# Patient Record
Sex: Female | Born: 1935 | Race: White | Hispanic: No | Smoking: Never smoker
Health system: Southern US, Community
[De-identification: ages and names within clinical notes are randomized; demographics above are authoritative.]

## PROBLEM LIST (undated history)

## (undated) DIAGNOSIS — K222 Esophageal obstruction: Secondary | ICD-10-CM

## (undated) DIAGNOSIS — C50919 Malignant neoplasm of unspecified site of unspecified female breast: Secondary | ICD-10-CM

## (undated) DIAGNOSIS — R51 Headache: Secondary | ICD-10-CM

## (undated) DIAGNOSIS — F99 Mental disorder, not otherwise specified: Secondary | ICD-10-CM

## (undated) HISTORY — DX: Headache: R51

## (undated) HISTORY — PX: BACK SURGERY: SHX140

## (undated) HISTORY — DX: Mental disorder, not otherwise specified: F99

## (undated) HISTORY — PX: CATARACT EXTRACTION: SUR2

## (undated) HISTORY — PX: APPENDECTOMY: SHX54

## (undated) HISTORY — DX: Esophageal obstruction: K22.2

## (undated) HISTORY — PX: ABDOMINAL HYSTERECTOMY: SHX81

## (undated) HISTORY — DX: Malignant neoplasm of unspecified site of unspecified female breast: C50.919

---

## 1966-08-26 HISTORY — PX: MASTECTOMY: SHX3

## 2009-01-24 HISTORY — PX: OTHER SURGICAL HISTORY: SHX169

## 2009-02-02 ENCOUNTER — Encounter: Admission: RE | Admit: 2009-02-02 | Discharge: 2009-04-27 | Payer: Self-pay | Admitting: Orthopedic Surgery

## 2009-09-26 DIAGNOSIS — F99 Mental disorder, not otherwise specified: Secondary | ICD-10-CM

## 2009-09-26 HISTORY — DX: Mental disorder, not otherwise specified: F99

## 2009-10-05 ENCOUNTER — Ambulatory Visit: Payer: Self-pay | Admitting: Family Medicine

## 2009-10-05 DIAGNOSIS — Z853 Personal history of malignant neoplasm of breast: Secondary | ICD-10-CM

## 2009-10-05 DIAGNOSIS — F068 Other specified mental disorders due to known physiological condition: Secondary | ICD-10-CM

## 2009-10-05 DIAGNOSIS — N951 Menopausal and female climacteric states: Secondary | ICD-10-CM | POA: Insufficient documentation

## 2009-10-06 LAB — CONVERTED CEMR LAB
ALT: 30 units/L (ref 0–35)
AST: 25 units/L (ref 0–37)
Albumin: 4.4 g/dL (ref 3.5–5.2)
Alkaline Phosphatase: 71 units/L (ref 39–117)
BUN: 21 mg/dL (ref 6–23)
CO2: 22 meq/L (ref 19–32)
Cholesterol: 318 mg/dL — ABNORMAL HIGH (ref 0–200)
Creatinine, Ser: 0.98 mg/dL (ref 0.40–1.20)
Glucose, Bld: 98 mg/dL (ref 70–99)
MCHC: 33.3 g/dL (ref 30.0–36.0)
MCV: 92.6 fL (ref 78.0–100.0)
Total Protein: 7.3 g/dL (ref 6.0–8.3)
VLDL: 24 mg/dL (ref 0–40)
Vit D, 25-Hydroxy: 28 ng/mL — ABNORMAL LOW (ref 30–89)
WBC: 7.7 10*3/uL (ref 4.0–10.5)

## 2009-10-12 ENCOUNTER — Ambulatory Visit: Payer: Self-pay | Admitting: Family Medicine

## 2009-10-12 DIAGNOSIS — E785 Hyperlipidemia, unspecified: Secondary | ICD-10-CM

## 2009-10-12 DIAGNOSIS — E559 Vitamin D deficiency, unspecified: Secondary | ICD-10-CM | POA: Insufficient documentation

## 2009-10-14 ENCOUNTER — Encounter: Admission: RE | Admit: 2009-10-14 | Discharge: 2009-10-14 | Payer: Self-pay | Admitting: Family Medicine

## 2009-10-14 ENCOUNTER — Encounter: Payer: Self-pay | Admitting: Family Medicine

## 2009-10-18 ENCOUNTER — Telehealth: Payer: Self-pay | Admitting: Family Medicine

## 2009-10-24 ENCOUNTER — Encounter: Payer: Self-pay | Admitting: Family Medicine

## 2009-10-24 ENCOUNTER — Telehealth: Payer: Self-pay | Admitting: Family Medicine

## 2009-10-24 DIAGNOSIS — K222 Esophageal obstruction: Secondary | ICD-10-CM

## 2009-10-24 HISTORY — DX: Esophageal obstruction: K22.2

## 2009-10-24 LAB — HM COLONOSCOPY: HM Colonoscopy: NORMAL

## 2009-10-25 ENCOUNTER — Encounter: Payer: Self-pay | Admitting: Family Medicine

## 2009-10-27 ENCOUNTER — Encounter: Payer: Self-pay | Admitting: Family Medicine

## 2009-12-01 ENCOUNTER — Ambulatory Visit: Payer: Self-pay | Admitting: Family Medicine

## 2009-12-01 DIAGNOSIS — M4850XA Collapsed vertebra, not elsewhere classified, site unspecified, initial encounter for fracture: Secondary | ICD-10-CM

## 2009-12-13 ENCOUNTER — Telehealth: Payer: Self-pay | Admitting: Family Medicine

## 2009-12-13 DIAGNOSIS — R9409 Abnormal results of other function studies of central nervous system: Secondary | ICD-10-CM

## 2009-12-23 ENCOUNTER — Encounter: Admission: RE | Admit: 2009-12-23 | Discharge: 2009-12-23 | Payer: Self-pay | Admitting: Family Medicine

## 2010-01-26 ENCOUNTER — Encounter: Payer: Self-pay | Admitting: Family Medicine

## 2010-04-02 ENCOUNTER — Telehealth: Payer: Self-pay | Admitting: Family Medicine

## 2010-04-05 ENCOUNTER — Ambulatory Visit: Payer: Self-pay | Admitting: Family Medicine

## 2010-04-06 LAB — CONVERTED CEMR LAB
BUN: 14 mg/dL (ref 6–23)
Creatinine, Ser: 0.94 mg/dL (ref 0.40–1.20)

## 2010-04-07 ENCOUNTER — Encounter: Admission: RE | Admit: 2010-04-07 | Discharge: 2010-04-07 | Payer: Self-pay | Admitting: Family Medicine

## 2010-04-26 ENCOUNTER — Encounter: Payer: Self-pay | Admitting: Family Medicine

## 2010-05-18 ENCOUNTER — Encounter: Payer: Self-pay | Admitting: Family Medicine

## 2010-05-20 LAB — CONVERTED CEMR LAB
Cholesterol: 253 mg/dL — ABNORMAL HIGH (ref 0–200)
LDL Cholesterol: 177 mg/dL — ABNORMAL HIGH (ref 0–99)
Total CHOL/HDL Ratio: 5.1
VLDL: 26 mg/dL (ref 0–40)

## 2010-06-18 ENCOUNTER — Telehealth: Payer: Self-pay | Admitting: Family Medicine

## 2010-06-19 ENCOUNTER — Encounter: Payer: Self-pay | Admitting: Family Medicine

## 2010-06-20 LAB — CONVERTED CEMR LAB
Cholesterol: 210 mg/dL — ABNORMAL HIGH (ref 0–200)
VLDL: 27 mg/dL (ref 0–40)

## 2010-09-13 ENCOUNTER — Telehealth: Payer: Self-pay | Admitting: Family Medicine

## 2010-09-21 ENCOUNTER — Telehealth: Payer: Self-pay | Admitting: Family Medicine

## 2010-09-25 NOTE — Consult Note (Signed)
Summary: [facility]  [facility]   Imported By: Lanelle Bal 05/08/2010 11:35:28  _____________________________________________________________________  External Attachment:    Type:   Image     Comment:   External Document

## 2010-09-25 NOTE — Letter (Signed)
Summary: Letter to Patient Regarding Path Results/[facility]  Letter to Patient Regarding Path Results/[facility]   Imported By: Lanelle Bal 11/03/2009 11:26:14  _____________________________________________________________________  External Attachment:    Type:   Image     Comment:   External Document

## 2010-09-25 NOTE — Procedures (Signed)
Summary: Colonoscopy- [facility]   Flex Sig Next Due:  Not Indicated Colonoscopy Result Date:  10/24/2009 Colonoscopy Result:  normal Colonoscopy Next Due:  10 yr Hemoccult Next Due:  Not Indicated PAP Next Due:  Not Indicated  Also had endoscopy which showed a schatzkis ring.      Past History:  Past Medical History: Breast cancer, hx of Headache MMSE 27/30 on 09/2009 endoscopy which showed a schatzkis ring 10/2009 [facility]    Appended Document: Colonoscopy- [facility]  Last Colonoscopy:  normal (10/24/2009 2:10:05 PM) Colonoscopy Next Due:  5 yr

## 2010-09-25 NOTE — Letter (Signed)
Summary: Letter Regarding Colonoscopy Results/[facility]  Letter Regarding Colonoscopy Results/[facility]   Imported By: Lanelle Bal 11/06/2009 12:08:47  _____________________________________________________________________  External Attachment:    Type:   Image     Comment:   External Document

## 2010-09-25 NOTE — Progress Notes (Signed)
Summary: MRI report results.   Phone Note Outgoing Call   Summary of Call: Call pt: May have a tiny aneurysm  versus a pseudo-lesion of teh left. Otherwise normal. Nothing that indicates a reason for memory loss. They do recommend a f/u MRA to make sure stable in 2-3 months.  Initial call taken by: Nani Gasser MD,  October 18, 2009 2:58 PM  Follow-up for Phone Call        Pt notified of above results and MD instructions. KJ LPN Follow-up by: Kathlene November,  October 18, 2009 3:12 PM

## 2010-09-25 NOTE — Letter (Signed)
Summary: MMSE Form/[place] [facility]  MMSE Form/[place] [facility]   Imported By: Lanelle Bal 10/17/2009 12:56:22  _____________________________________________________________________  External Attachment:    Type:   Image     Comment:   External Document

## 2010-09-25 NOTE — Progress Notes (Signed)
Summary: ? MRI  Phone Note Call from Patient Call back at Home Phone [phone]   Caller: Patient Call For: Nani Gasser MD Summary of Call: pt called and wants to know if you want to do another MRI on her head. York Spaniel had one done in February and thought you said would need a followup on it Initial call taken by: Kathlene November,  December 13, 2009 9:49 AM  Follow-up for Phone Call        Will schedule.  Follow-up by: Nani Gasser MD,  December 13, 2009 12:53 PM  New Problems: MRI, BRAIN, ABNORMAL (ICD-794.09)   New Problems: MRI, BRAIN, ABNORMAL (ICD-794.09)

## 2010-09-25 NOTE — Assessment & Plan Note (Signed)
Summary: BACK PAIN   Vital Signs:  Patient Profile:   75 Years Old Female CC:      fell this am onto back Height:     63 inches Weight:      150 pounds O2 Sat:      98 % O2 treatment:    Room Air Temp:     97.7 degrees F oral Pulse rate:   77 / minute Resp:     12 per minute BP standing:   148 / 84  (right arm) Cuff size:   regular  Pt. in pain?   yes    Location:   lower back    Intensity:   8    Type:       aching  Vitals Entered By: Lajean Saver RN (December 01, 2009 11:38 AM)                   Updated Prior Medication List: No Medications Current Allergies (reviewed today): ! CODEINE ! LIPITOR (ATORVASTATIN CALCIUM) ! CRESTOR (ROSUVASTATIN CALCIUM)History of Present Illness Chief Complaint: fell this am onto back History of Present Illness: Subjective:  While sitting on a small stool today, patient fell backwards landing on her buttocks and back.  She complains primarily of pain in her buttocks.  She has a past history of spinal fracture.  No distal paresthesias.   Her back pain does not radiate.  No bowel or bladder dysfunction.  No saddle numbness.  REVIEW OF SYSTEMS Constitutional Symptoms      Denies fever, chills, night sweats, weight loss, weight gain, and fatigue.  Eyes       Denies change in vision, eye pain, eye discharge, glasses, contact lenses, and eye surgery. Ear/Nose/Throat/Mouth       Denies hearing loss/aids, change in hearing, ear pain, ear discharge, dizziness, frequent runny nose, frequent nose bleeds, sinus problems, sore throat, hoarseness, and tooth pain or bleeding.  Respiratory       Denies dry cough, productive cough, wheezing, shortness of breath, asthma, bronchitis, and emphysema/COPD.  Cardiovascular       Denies murmurs, chest pain, and tires easily with exhertion.    Gastrointestinal       Denies stomach pain, nausea/vomiting, diarrhea, constipation, blood in bowel movements, and indigestion. Genitourniary       Denies painful  urination, kidney stones, and loss of urinary control. Neurological       Denies paralysis, seizures, and fainting/blackouts. Musculoskeletal       Complains of muscle pain.      Denies joint pain, joint stiffness, decreased range of motion, redness, swelling, muscle weakness, and gout.      Comments: lower back Skin       Denies bruising, unusual mles/lumps or sores, and hair/skin or nail changes.  Psych       Denies mood changes, temper/anger issues, anxiety/stress, speech problems, depression, and sleep problems. Other Comments: patient fell from stool this AM onto back. Small abrasion on left elbow   Past History:  Past Medical History: Reviewed history from 10/25/2009 and no changes required. Breast cancer, hx of Headache MMSE 27/30 on 09/2009 endoscopy which showed a schatzkis ring 10/2009 [facility]  Past Surgical History: Caesarean section Cataract extraction Hysterectomy Mastectomy, bilat and then recontruction.  1968 Back surgery Rt Shoulder 01/2009  Family History: Reviewed history from 10/05/2009 and no changes required. Family History Breast cancer 1st degree relative <50 Family History Diabetes 1st degree relative Family History High cholesterol Family History  Hypertension Family History of Stroke M 1st degree relative <50  Social History: Reviewed history from 10/05/2009 and no changes required. Retired  from [employer]. Lives with her son.  Widow/Widower Never Smoked Alcohol use-no Drug use-no Regular exercise-yes   Objective:  No acute distress  Eyes:  Pupils are equal, round, and reactive to light and accomdation.  Extraocular movement is intact.  Conjunctivae are not inflamed.  Neck:  full range of motion, nontender Lungs:  Clear to auscultation.  Breath sounds are equal.  Heart:  Regular rate and rhythm without murmurs, rubs, or gallops.  Back:  Tenderness midline approximately T6, and sacrum/coccyx.  Negative straight leg raise and  sitting knee extension.  Patellar and achilles reflexes normal X-rays sacrum/coccyx, thoracic spine, and LS spine negative for acute fracture Assessment New Problems: BACK PAIN (ICD-724.5)   Plan New Orders: T-DG Thoracic Spine w/Swimmers [72072] T-DG Lumbar Spine Complete [72110] T-DG Sacrum/Coccyx [16109] New Patient Level III [60454] Planning Comments:   Apply ice pack for 30 to 45 minutes every 1 to 4 hours.  Continue until swelling decreases.  Recommend doughnut pad for sitting. Ibuprofen 200mg , 4 tabs every 8 hours with food  ([facility] information and instruction patient handout given)  Follow-up with PCP if not improving 7 to 10 days.   The patient and/or caregiver has been counseled thoroughly with regard to medications prescribed including dosage, schedule, interactions, rationale for use, and possible side effects and they verbalize understanding.  Diagnoses and expected course of recovery discussed and will return if not improved as expected or if the condition worsens. Patient and/or caregiver verbalized understanding.

## 2010-09-25 NOTE — Progress Notes (Signed)
Summary: back pain  Phone Note Call from Patient Call back at Home Phone [phone]   Caller: Patient Call For: Kaylee Gasser MD Summary of Call: Pt calls and states fell back in March and done xrays- but her back is still bothering her and said you all had discussed either an MRI or referral. Please advise Initial call taken by: Kathlene November,  April 02, 2010 9:39 AM  Follow-up for Phone Call        Needs appt.  I haven't evaluated her for this and need.  Follow-up by: Kaylee Gasser MD,  April 02, 2010 11:54 AM  Additional Follow-up for Phone Call Additional follow up Details #1::        pt notified and scheduled appt Additional Follow-up by: Kathlene November,  April 02, 2010 12:42 PM

## 2010-09-25 NOTE — Assessment & Plan Note (Signed)
Summary: F/U MEMORY LOSS   Vital Signs:  Patient profile:   75 year old female Height:      63 inches Weight:      155 pounds Pulse rate:   68 / minute BP sitting:   137 / 77  (left arm) Cuff size:   regular  Vitals Entered By: Kathlene November (October 12, 2009 9:43 AM) CC: discuss memory loss   Primary Care Provider:  Nani Gasser MD  CC:  discuss memory loss.  History of Present Illness: Having memory problemsin the alst 2 year.  Worse in the last 8-9 months.  Havig more problems with short term memories.  Having difficutly remembering where placed things.  Does well iwth balancing check book. No family hx of memory problems. Occ gets Headache at night. Not every night. Lasts about one minutes.  Feels like a dull ache almost"like hit head severly" on something.  Pain on the top of the head. Noticed it started since October.  No vision changes.   Current Medications (verified): 1)  Calcium-Vitamin D 600-200 Mg-Unit Tabs (Calcium-Vitamin D) .... Take 1 Tablet By Mouth Two Times A Day  Allergies (verified): 1)  ! Codeine 2)  ! Lipitor (Atorvastatin Calcium) 3)  ! Crestor (Rosuvastatin Calcium)  Comments:  Nurse/Medical Assistant: The patient's medications and allergies were reviewed with the patient and were updated in the Medication and Allergy Lists. Kathlene November (October 12, 2009 9:44 AM)  Past History:  Past Medical History: Breast cancer, hx of Headache MMSE 27/30 on 09/2009  Physical Exam  General:  Well-developed,well-nourished,in no acute distress; alert,appropriate and cooperative throughout examination Head:  Normocephalic and atraumatic without obvious abnormalities. No apparent alopecia or balding. Lungs:  Normal respiratory effort, chest expands symmetrically. Lungs are clear to auscultation, no crackles or wheezes. Heart:  Normal rate and regular rhythm. S1 and S2 normal without gallop, murmur, click, rub or other extra sounds. Skin:  no rashes.     Psych:  Cognition and judgment appear intact. Alert and cooperative with normal attention span and concentration. No apparent delusions, illusions, hallucinations   Impression & Recommendations:  Problem # 1:  DEMENTIA (ICD-294.8)  Passed MMSE with score of 27/30.  Passing was 27.  Labs were normal excpet for cholesterol and vitamin D.  Will schedule for an MRI since has also having HA that started in October.  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   Orders: T-MRA Head w/o contrast (28315)  Problem # 2:  HYPERLIPIDEMIA (ICD-272.4)  Recheck labs in 6-8 weeks and liver function.   Her updated medication list for this problem includes:    Lovastatin 40 Mg Tabs (Lovastatin) .Marland Kitchen... Take 1 tablet by mouth once a day at bedtime  Problem # 3:  VITAMIN D DEFICIENCY (ICD-268.9) Increase vitamin D to 1000-2000iu once a day.    Complete Medication List: 1)  Calcium-vitamin D 600-200 Mg-unit Tabs (Calcium-vitamin d) .... Take 1 tablet by mouth two times a day 2)  Lovastatin 40 Mg Tabs (Lovastatin) .... Take 1 tablet by mouth once a day at bedtime 3)  Vitamin D 1000 Unit Tabs (Cholecalciferol) .... Take 1 tablet by mouth two times a day Prescriptions: LOVASTATIN 40 MG TABS (LOVASTATIN) Take 1 tablet by mouth once a day at bedtime  #30 x 6   Entered and Authorized by:   Nani Gasser MD   Signed by:   Nani Gasser MD on 10/12/2009   Method used:   Print then  Give to Patient   RxID:   [id number]

## 2010-09-25 NOTE — Progress Notes (Signed)
Summary: Diagnosis Code  Phone Note Other Incoming Call back at [phone] ext [phone]   Caller: [facility]  Summary of Call: [facility] is requesting a diagnosis code for services rendered on 10/05/2009 for Lipid panel. Previous diagnoses code not supported Initial call taken by: Glendell Docker CMA,  October 24, 2009 4:39 PM  Follow-up for Phone Call        272.4 should cover it.  Follow-up by: Nani Gasser MD,  October 25, 2009 2:00 PM  Additional Follow-up for Phone Call Additional follow up Details #1::        Spoke with [facility] Additional Follow-up by: Kathlene November,  October 25, 2009 2:31 PM

## 2010-09-25 NOTE — Consult Note (Signed)
Summary: [facility]   Imported By: Lanelle Bal 02/05/2010 07:55:34  _____________________________________________________________________  External Attachment:    Type:   Image     Comment:   External Document

## 2010-09-25 NOTE — Progress Notes (Signed)
Summary: Labs? and cholesterol med refill  Phone Note Call from Patient Call back at Home Phone [phone]   Caller: Patient Call For: Nani Gasser MD Reason for Call: Privacy/Consent Authorization Summary of Call: Pt states she is tolerating the [facility] ok and wanted to know if she needs to get any labs or just continue. Needs rx  for mail order for [facility] sent Initial call taken by: Kathlene November LPN,  June 18, 2010 8:45 AM  Follow-up for Phone Call        Yes, lets recheck Lipids.  Will refill med.  Follow-up by: Nani Gasser MD,  June 18, 2010 9:47 AM  Additional Follow-up for Phone Call Additional follow up Details #1::        called and notified pt and faxed order Additional Follow-up by: Avon Gully CMA, Duncan Dull),  June 18, 2010 11:15 AM    Prescriptions: WELCHOL 3.75 GM PACK (COLESEVELAM HCL) one pack daily in the AM mixed in a glass of water.  #90 x 3   Entered and Authorized by:   Nani Gasser MD   Signed by:   Nani Gasser MD on 06/18/2010   Method used:   Electronically to        [facility] (retail)       [address] [place], [place]  [address]       Ph: [phone]       Fax: [phone]   RxID:   [id number]

## 2010-09-25 NOTE — Assessment & Plan Note (Signed)
Summary: Medicare CPE   Vital Signs:  Patient profile:   75 year old female Height:      63 inches Weight:      156 pounds BMI:     27.73 Temp:     98.2 degrees F oral Pulse rate:   74 / minute BP sitting:   160 / 89  (left arm) Cuff size:   regular  Vitals Entered By: Alfred Levins, CMA (October 05, 2009 1:17 PM) CC: cpx, fasting   Primary Care Provider:  Nani Gasser MD  CC:  cpx and fasting.  History of Present Illness: Had colonoscopy over 10 years ago.  Hasn't been to the MD in a long time afer her husband got cancer and then passed away. Here for CPE. Says has hx of lipids but on ly take a calcium supplement.   Habits & Providers  Alcohol-Tobacco-Diet     Tobacco Status: never  Exercise-Depression-Behavior     Does Patient Exercise: yes     Drug Use: no  Current Medications (verified): 1)  None  Allergies (verified): 1)  ! Codeine  Past History:  Past Medical History: Breast cancer, hx of Headache  Past Surgical History: Caesarean section Cataract extraction Hysterectomy Mastectomy, bilat and then recontruction.  1968 Back surgery Rt Shoulder   Family History: Family History Breast cancer 1st degree relative <50 Family History Diabetes 1st degree relative Family History High cholesterol Family History Hypertension Family History of Stroke M 1st degree relative <50  Social History: Retired  from [employer]. Lives with her son.  Widow/Widower Never Smoked Alcohol use-no Drug use-no Regular exercise-yes Smoking Status:  never Drug Use:  no Does Patient Exercise:  yes  Review of Systems       No fever/sweats/weakness, unexplained weight loss/gain.  No vison changes.  No difficulty hearing/ringing in ears, hay fever/allergies.  No chest pain/discomfort, palpitations.  No Br lump/nipple discharge.  No cough/wheeze.  No blood in BM, nausea/vomiting/diarrhea.  No nighttime urination, leaking urine, unusual vaginal bleeding, discharge  (penis or vagina).  + muscle/joint pain. No rash, change in mole.  + HA, +memory loss.  No anxiety, sleep d/o, depression.  No easy bruising/bleeding, unexplained lump   Physical Exam  General:  Well-developed,well-nourished,in no acute distress; alert,appropriate and cooperative throughout examination Head:  Normocephalic and atraumatic without obvious abnormalities. No apparent alopecia or balding. Eyes:  No corneal or conjunctival inflammation noted. EOMI. Perrla.  Ears:  External ear exam shows no significant lesions or deformities.  Otoscopic examination reveals clear canals, tympanic membranes are intact bilaterally without bulging, retraction, inflammation or discharge. Hearing is grossly normal bilaterally. Nose:  External nasal examination shows no deformity or inflammation.  Mouth:  Oral mucosa and oropharynx without lesions or exudates.  Teeth in good repair. Neck:  No deformities, masses, or tenderness noted. Lungs:  Normal respiratory effort, chest expands symmetrically. Lungs are clear to auscultation, no crackles or wheezes. Heart:  Normal rate and regular rhythm. S1 and S2 normal without gallop, murmur, click, rub or other extra sounds. Skin:  no rashes.   Cervical Nodes:  No lymphadenopathy noted Psych:  Cognition and judgment appear intact. Alert and cooperative with normal attention span and concentration. No apparent delusions, illusions, hallucinations   Impression & Recommendations:  Problem # 1:  HEALTH MAINTENANCE EXAM (ICD-V70.0) Exam is normal.  Due for DEXA and screening colonoscopy.  Due for screening labs.   Given pneumonia vaccine.   BP elevated. Recheck at f/u. If still elevated will need to  treat.  Orders: Pneumonia Shot (Medicare) (G0009) 1st annual wellness visit with prevention plan [phone]) T-Comprehensive Metabolic Panel [phone]) T-Lipid Profile ([id number])  Problem # 2:  DEMENTIA (ICD-294.8) She is owrried about early dementia.  Has noticed  more difficulty with her memory. Will go ahead and order some labs today since drawing blood but encouraged her to make a separate appt to doa  MMSE adn adress further if may need an MRI of the head.  Orders: T-TSH [phone]) T-CBC No Diff [phone]) T-Vitamin D (25-Hydroxy) [id number]) T-Vitamin B12 ([id number]) T-Folate (25956)  Complete Medication List: 1)  Calcium-vitamin D 600-200 Mg-unit Tabs (Calcium-vitamin d) .... Take 1 tablet by mouth two times a day  Other Orders: T-Dual DXA Bone Density/ Axial (38756) Gastroenterology Referral (GI)   Contraindications/Deferment of Procedures/Staging:    Test/Procedure: PAP Smear    Reason for deferment: not indicated   Patient Instructions: 1)  Schedule an appointment to further discuss your memory problems.  2)  We will call you with referral for colonoscopy and for the bone density test.   Flu Vaccine Next Due:  Not Indicated TD Result Date:  07/26/2004 TD Result:  given TD Next Due:  10 yr Mammogram Next Due:  Not Indicated  Appended Document: Medicare CPE    Nurse Visit   Allergies: 1)  ! Codeine  Immunizations Administered:  Pneumonia Vaccine:    Vaccine Type: Pneumovax (Medicare)    Site: left deltoid    Mfr: Merck    Dose: 0.5 ml    Route: IM    Given by: Alfred Levins, CMA    Exp. Date: 09/22/2010    Lot #: [id number]  Orders Added: 1)  Pneumococcal Vaccine [90732] 2)  Admin 1st Vaccine [43329]

## 2010-09-25 NOTE — Assessment & Plan Note (Signed)
Summary: back pain   Vital Signs:  Patient profile:   75 year old female Height:      63 inches Weight:      153 pounds Pulse rate:   81 / minute BP sitting:   147 / 81  (left arm) Cuff size:   regular  Vitals Entered By: Avon Gully CMA, Duncan Dull) (April 05, 2010 12:56 PM) CC: lower back pain,pt fell in march and pain has progressivly gotten worse   Primary Care Provider:  Nani Gasser MD  CC:  lower back pain and pt fell in march and pain has progressivly gotten worse.  History of Present Illness: lower back pain,pt fell in march and pain has progressivly gotten worse.  Was on a stool cleaning cabinets and feel directly on her bottom. Was seen in UC and had negative xrays. Told ot give it time and if still painful to f/u.   Injury was intially back in March. Painful to get up for sit prolonged period.  Donut make her hemorrhoids worse . No pain radiating  into her legs.  Has to sit to the side if sits for more than a few minutes. Not taking any medications for it.   Current Medications (verified): 1)  None  Allergies (verified): 1)  ! Codeine 2)  ! Lipitor (Atorvastatin Calcium) 3)  ! Crestor (Rosuvastatin Calcium)  Comments:  Nurse/Medical Assistant: The patient's medications and allergies were reviewed with the patient and were updated in the Medication and Allergy Lists. Avon Gully CMA, Duncan Dull) (April 05, 2010 12:57 PM)  Physical Exam  General:  Well-developed,well-nourished,in no acute distress; alert,appropriate and cooperative throughout examination Msk:  TEdner over the lower sacrum but not directly over the coccyx.  Hips with NROM. No bruising or trauma.  Psych:  Cognition and judgment appear intact. Alert and cooperative with normal attention span and concentration. No apparent delusions, illusions, hallucinations   Impression & Recommendations:  Problem # 1:  COCCYGEAL PAIN (ICD-724.79) Will get MRI since has been 5 months and it is getting  worse.  Rule out fracture.  Will check labs incase needs contrast. For now can use Tyenol as needed if needed.  Orders: T-*Unlisted MRI Procedure (16109) T-BUN ([id number]) T-Creatinine Blood ([id number])  Patient Instructions: 1)  We will call you with the appointment for your MRI

## 2010-09-27 NOTE — Progress Notes (Signed)
Summary: Med question  Phone Note Call from Patient Call back at Home Phone [phone]   Caller: Patient Summary of Call: VM left by pt that she is having sore legs, feet and knees since beginning cholesterol med. RC to pt who states that about a month ago she began having pain in her legs.  She began Whelchol last.  Pt states it burns and stings like a sunburn. Pt denies any redness or rash, swelling, and legs do not feel hot to touch.  Symptoms are only in hips and legs.  Pt also denies any new meds, creams, pants or detergents.  Please advise if pt needs OV or should she d/c med and see if that helps symptoms? Initial call taken by: Francee Piccolo CMA Duncan Dull),  September 13, 2010 2:29 PM  Follow-up for Phone Call        [facility] for one week and see if better. Let her know that this would be very unlikly as welchol isn't absorbed into the body. Only works through the gut so can't cause joint and muscle pains. Have her f.u in one week Follow-up by: Nani Gasser MD,  September 13, 2010 2:32 PM  Additional Follow-up for Phone Call Additional follow up Details #1::        pt agreeable with plan.  she will call in the AM to shedule her physical appt for February. Additional Follow-up by: Francee Piccolo CMA Duncan Dull),  September 13, 2010 4:51 PM

## 2010-10-03 NOTE — Progress Notes (Signed)
Summary: [facility]-Medication follow up  Phone Note Call from Patient Call back at [facility] Phone [phone]   Caller: Patient Summary of Call: Follow up call from pt.  Her joints are as painful as always, burning is better but not resolved in legs and feet.  Pt has been off Whelcol since last week.  Should pt restart Whelcol?  Does she need eval?  Please advise. Initial call taken by: Francee Piccolo CMA Duncan Dull),  September 21, 2010 11:35 AM  Follow-up for Phone Call        Stay off for one more week and see if continues to improve. Call our office. If no continued improvment then yes try restarting the [facility] and see what happens.  Follow-up by: Nani Gasser MD,  September 21, 2010 12:29 PM  Additional Follow-up for Phone Call Additional follow up Details #1::        pt is agreeable with above plan.  She will call with an update on Friday. Additional Follow-up by: Francee Piccolo CMA Duncan Dull),  September 25, 2010 10:56 AM

## 2010-10-08 ENCOUNTER — Telehealth (INDEPENDENT_AMBULATORY_CARE_PROVIDER_SITE_OTHER): Payer: Self-pay | Admitting: *Deleted

## 2010-10-08 ENCOUNTER — Encounter (INDEPENDENT_AMBULATORY_CARE_PROVIDER_SITE_OTHER): Payer: Medicare PPO | Admitting: Family Medicine

## 2010-10-08 ENCOUNTER — Other Ambulatory Visit: Payer: Self-pay | Admitting: Family Medicine

## 2010-10-08 ENCOUNTER — Encounter: Payer: Self-pay | Admitting: Family Medicine

## 2010-10-08 DIAGNOSIS — R300 Dysuria: Secondary | ICD-10-CM

## 2010-10-08 DIAGNOSIS — Z0000 Encounter for general adult medical examination without abnormal findings: Secondary | ICD-10-CM

## 2010-10-08 DIAGNOSIS — G609 Hereditary and idiopathic neuropathy, unspecified: Secondary | ICD-10-CM

## 2010-10-08 LAB — CONVERTED CEMR LAB
Bilirubin Urine: NEGATIVE
Glucose, Urine, Semiquant: NEGATIVE
Ketones, urine, test strip: NEGATIVE
Urobilinogen, UA: 0.2

## 2010-10-09 ENCOUNTER — Encounter: Payer: Self-pay | Admitting: Family Medicine

## 2010-10-09 LAB — CONVERTED CEMR LAB
BUN: 21 mg/dL (ref 6–23)
CO2: 23 meq/L (ref 19–32)
Chloride: 108 meq/L (ref 96–112)
Creatinine, Ser: 0.91 mg/dL (ref 0.40–1.20)
Iron: 93 ug/dL (ref 42–145)
LDL Cholesterol: 213 mg/dL — ABNORMAL HIGH (ref 0–99)
Magnesium: 1.8 mg/dL (ref 1.5–2.5)
Potassium: 4.2 meq/L (ref 3.5–5.3)
Sodium: 142 meq/L (ref 135–145)
TSH: 3.459 microintl units/mL (ref 0.350–4.500)
Total Bilirubin: 0.5 mg/dL (ref 0.3–1.2)
Total CHOL/HDL Ratio: 7.2
Total Protein: 7.2 g/dL (ref 6.0–8.3)
Vit D, 25-Hydroxy: 36 ng/mL (ref 30–89)
Vitamin B-12: 908 pg/mL (ref 211–911)

## 2010-10-11 ENCOUNTER — Other Ambulatory Visit: Payer: Self-pay | Admitting: Sports Medicine

## 2010-10-11 ENCOUNTER — Encounter: Payer: Self-pay | Admitting: Family Medicine

## 2010-10-11 ENCOUNTER — Ambulatory Visit
Admission: RE | Admit: 2010-10-11 | Discharge: 2010-10-11 | Disposition: A | Discharge status: Home / Self Care | Payer: Medicare PPO | Source: Ambulatory Visit | Attending: Sports Medicine | Admitting: Sports Medicine

## 2010-10-11 DIAGNOSIS — M25561 Pain in right knee: Secondary | ICD-10-CM

## 2010-10-17 NOTE — Progress Notes (Signed)
----   Converted from flag ---- ---- 10/08/2010 9:25 AM, Nani Gasser MD wrote: Call pt: Urine + so will send over ABX for tx. ------------------------------  10/08/10 acm 2:17 pt notified

## 2010-10-17 NOTE — Assessment & Plan Note (Signed)
Summary: CPE   Vital Signs:  Patient profile:   75 year old female Height:      63 inches Weight:      151 pounds Pulse rate:   94 / minute BP sitting:   157 / 82  (right arm) Cuff size:   regular  Vitals Entered By: Avon Gully CMA, Duncan Dull) (October 08, 2010 8:51 AM) CC: CPE   Primary Care Provider:  Nani Gasser MD  CC:  CPE.  History of Present Illness: Burnign adn stingkingin her legs. Feels like they are extremely cold.  Starts at her pelvix and goes to her toes. Burning is worse on her feet adn on the inside of her legs to above her knee.  Right led is worse. Hx of OA in her spine and has had surgery on her spine.   Needs referral for knee pain which she feels is just arthrisits. Her righ tknee in particular is most painful. Marland Kitchen Has never had xrays. Has had hx of arthrisits in her hips years ago.    Dysuria occ. NO fever or no change in back pain. No hematuria.   Current Medications (verified): 1)  Lovastatin 40 Mg Tabs (Lovastatin) .... Take 1 Tablet By Mouth Once A Day At Bedtime 2)  Welchol 3.75 Gm Pack (Colesevelam Hcl) .... One Pack Daily in The Am Mixed in A Glass of Water.  Allergies (verified): 1)  ! Codeine 2)  ! Lipitor (Atorvastatin Calcium) 3)  ! Crestor (Rosuvastatin Calcium)  Comments:  Nurse/Medical Assistant: The patient's medications and allergies were reviewed with the patient and were updated in the Medication and Allergy Lists. Avon Gully CMA, Duncan Dull) (October 08, 2010 8:52 AM)  Past History:  Past Medical History: Last updated: 10/25/2009 Breast cancer, hx of Headache MMSE 27/30 on 09/2009 endoscopy which showed a schatzkis ring 10/2009 at [facility]  Social History: Last updated: 10/08/2010 Retired  from [employer]. Lives with her son.  Widow/Widower Never Smoked Alcohol use-no Drug use-no Regular exercise-some  Past Surgical History: Caesarean section Cataract extraction, both Complete  Hysterectomy Mastectomy, bilat and then recontruction.  1968 Back surgery, cervical, thoracic, and lumbar Rt Shoulder 01/2009 Appendectomy.  Social History: Retired  from [employer]. Lives with her son.  Widow/Widower Never Smoked Alcohol use-no Drug use-no Regular exercise-some  Review of Systems  The patient denies anorexia, fever, weight loss, weight gain, vision loss, decreased hearing, hoarseness, chest pain, syncope, dyspnea on exertion, peripheral edema, prolonged cough, headaches, hemoptysis, abdominal pain, melena, hematochezia, severe indigestion/heartburn, hematuria, incontinence, genital sores, muscle weakness, suspicious skin lesions, transient blindness, difficulty walking, depression, unusual weight change, abnormal bleeding, enlarged lymph nodes, and breast masses.         Occ dysuria.   Physical Exam  General:  Well-developed,well-nourished,in no acute distress; alert,appropriate and cooperative throughout examination Head:  Normocephalic and atraumatic without obvious abnormalities. No apparent alopecia or balding. Eyes:  No corneal or conjunctival inflammation noted. EOMI. Perrla.  Ears:  External ear exam shows no significant lesions or deformities.  Otoscopic examination reveals clear canals, tympanic membranes are intact bilaterally without bulging, retraction, inflammation or discharge. Hearing is grossly normal bilaterally. Nose:  External nasal examination shows no deformity or inflammation.  Mouth:  Oral mucosa and oropharynx without lesions or exudates.  Teeth in good repair. Neck:  No deformities, masses, or tenderness noted. Chest Wall:  No deformities, masses, or tenderness noted. Breasts:  No mass, nodules, thickening, tenderness, bulging, retraction, inflamation, nipple discharge or skin changes noted.  She  does have implants.  Lungs:  Normal respiratory effort, chest expands symmetrically. Lungs are clear to auscultation, no crackles or  wheezes. Heart:  Normal rate and regular rhythm. S1 and S2 normal without gallop, murmur, click, rub or other extra sounds. Abdomen:  Bowel sounds positive,abdomen soft and non-tender without masses, organomegaly or hernias noted. Msk:  No deformity or scoliosis noted of thoracic or lumbar spine.   Pulses:  R and L carotid,radial,dorsalis pedis and posterior tibial pulses are full and equal bilaterally Extremities:  No clubbing, cyanosis, edema, or deformity noted with normal full range of motion of all joints.   Neurologic:  No cranial nerve deficits noted. Station and gait are normal. DTRs are symmetrical throughout. Sensory, motor and coordinative functions appear intact. Skin:  no rashes.   Cervical Nodes:  No lymphadenopathy noted Psych:  Cognition and judgment appear intact. Alert and cooperative with normal attention span and concentration. No apparent delusions, illusions, hallucinations   Impression & Recommendations:  Problem # 1:  HEALTH MAINTENANCE EXAM (ICD-V70.0)  Exam is normal today. Due for bone density She says she doesn't get mammogram. Did discuss Zostavax with patient and given rx to take the pharmacy.   Due for screenign labs.   Orders: T-Lipid Profile ([id number])  Problem # 2:  DYSURIA (ICD-788.1)  UA today shows trace blood and LE. Will treat and send a cullure to confirm.   Orders: T-Urine Culture (Spectrum Order) ([id number])  Her updated medication list for this problem includes:    Bactrim Ds 800-160 Mg Tabs (Sulfamethoxazole-trimethoprim) .Marland Kitchen... Take 1 tablet by mouth two times a day for 3 days  Problem # 3:  PERIPHERAL NEUROPATHY (ICD-356.9)  I suspect may be coming from her low back since her pain is bilateral and is fromher hips down to her toes.  Will rule out B12 and other deficiencies. Will rule out thyroid d/o etc. Consider refer to Neuro for further evaluation.  Orders: T-Comprehensive Metabolic Panel [id number]) T-TSH  [id number]) T-Vitamin B12 [id number]) T-Iron [id number]) T-Magnesium ([phone])  Complete Medication List: 1)  Lovastatin 40 Mg Tabs (Lovastatin) .... Take 1 tablet by mouth once a day at bedtime 2)  Welchol 3.75 Gm Pack (Colesevelam hcl) .... One pack daily in the am mixed in a glass of water. 3)  Zostavax 38756 Unt/0.32ml Solr (Zoster vaccine live) .... Inject one dose im. 4)  Bactrim Ds 800-160 Mg Tabs (Sulfamethoxazole-trimethoprim) .... Take 1 tablet by mouth two times a day for 3 days  Other Orders: T-Dual DXA Bone Density/ Axial (43329) T-Vitamin D (25-Hydroxy) ([id number]) Orthopedic Referral (Ortho) Prescriptions: BACTRIM DS 800-160 MG TABS (SULFAMETHOXAZOLE-TRIMETHOPRIM) Take 1 tablet by mouth two times a day for 3 days  #6 x 0   Entered and Authorized by:   Nani Gasser MD   Signed by:   Nani Gasser MD on 10/08/2010   Method used:   Print then Give to Patient   RxID:   [id number] ZOSTAVAX 19400 UNT/0.65ML SOLR (ZOSTER VACCINE LIVE) inject one dose IM.  #1 x 0   Entered and Authorized by:   Nani Gasser MD   Signed by:   Nani Gasser MD on 10/08/2010   Method used:   Print then Give to Patient   RxID:   [id number]    Orders Added: 1)  T-Dual DXA Bone Density/ Axial [77080] 2)  T-Comprehensive Metabolic Panel [[id number]] 3)  T-Lipid Profile [80061-22930] 4)  T-TSH [15176-16073] 5)  T-Vitamin D (25-Hydroxy) [71062-69485] 6)  T-Vitamin B12 [82607-23330] 7)  T-Iron [46270-35009] 8)  T-Magnesium [04540-98119] 9)  Orthopedic Referral [Ortho] 10)  Est. Patient age 42&> [14782] 11)  Est. Patient Level II [99212] 12)  T-Urine Culture (Spectrum Order) [[id number]]     Laboratory Results   Urine Tests  Date/Time Received: 10/08/10 Date/Time Reported: 10/08/10  Routine Urinalysis   Color: yellow Appearance: Clear Glucose: negative   (Normal Range: Negative) Bilirubin: negative   (Normal Range: Negative) Ketone:  negative   (Normal Range: Negative) Spec. Gravity: 1.025   (Normal Range: 1.003-1.035) Blood: trace-intact   (Normal Range: Negative) pH: 5.5   (Normal Range: 5.0-8.0) Protein: negative   (Normal Range: Negative) Urobilinogen: 0.2   (Normal Range: 0-1) Nitrite: negative   (Normal Range: Negative) Leukocyte Esterace: small   (Normal Range: Negative)

## 2010-11-04 ENCOUNTER — Encounter: Payer: Self-pay | Admitting: Emergency Medicine

## 2010-11-04 ENCOUNTER — Ambulatory Visit (INDEPENDENT_AMBULATORY_CARE_PROVIDER_SITE_OTHER): Payer: Medicare PPO | Admitting: Emergency Medicine

## 2010-11-04 DIAGNOSIS — N390 Urinary tract infection, site not specified: Secondary | ICD-10-CM

## 2010-11-05 ENCOUNTER — Encounter: Payer: Self-pay | Admitting: Emergency Medicine

## 2010-11-07 ENCOUNTER — Telehealth (INDEPENDENT_AMBULATORY_CARE_PROVIDER_SITE_OTHER): Payer: Self-pay | Admitting: *Deleted

## 2010-11-13 NOTE — Medication Information (Signed)
Summary: PRESCRIPTION  PRESCRIPTION   Imported By: Tawni Carnes 11/05/2010 15:47:13  _____________________________________________________________________  External Attachment:    Type:   Image     Comment:   External Document

## 2010-11-13 NOTE — Progress Notes (Signed)
  Phone Note Outgoing Call   Call placed by: Clemens Catholic LPN,  November 07, 2010 12:11 PM Call placed to: Patient Summary of Call: call back: left message with urine culture results,  complete ABT, and call back if she has any questions or concerns. Initial call taken by: Clemens Catholic LPN,  November 07, 2010 12:11 PM

## 2010-11-13 NOTE — Assessment & Plan Note (Signed)
Summary: POSS BLADDER INF/WSE    Current Allergies: ! CODEINE ! LIPITOR (ATORVASTATIN CALCIUM) ! CRESTOR (ROSUVASTATIN CALCIUM) WELCHOL (COLESEVELAM HCL)  The patient and/or caregiver has been counseled thoroughly with regard to medications prescribed including dosage, schedule, interactions, rationale for use, and possible side effects and they verbalize understanding.  Diagnoses and expected course of recovery discussed and will return if not improved as expected or if the condition worsens. Patient and/or caregiver verbalized understanding.

## 2010-11-13 NOTE — Progress Notes (Signed)
Summary: OFFICE NOTE  OFFICE NOTE   Imported By: Tawni Carnes 11/05/2010 15:46:36  _____________________________________________________________________  External Attachment:    Type:   Image     Comment:   External Document

## 2010-11-13 NOTE — Consult Note (Signed)
Summary: [facility]  [facility]   Imported By: Maryln Gottron 11/06/2010 12:28:51  _____________________________________________________________________  External Attachment:    Type:   Image     Comment:   External Document

## 2010-12-14 ENCOUNTER — Other Ambulatory Visit: Payer: Self-pay | Admitting: Family Medicine

## 2010-12-14 MED ORDER — LOVASTATIN 40 MG PO TABS: 40.0000 mg | ORAL_TABLET | Freq: Every day | ORAL | Status: DC

## 2010-12-17 ENCOUNTER — Telehealth: Payer: Self-pay | Admitting: *Deleted

## 2010-12-17 DIAGNOSIS — I671 Cerebral aneurysm, nonruptured: Secondary | ICD-10-CM

## 2010-12-17 NOTE — Telephone Encounter (Signed)
We will schedule

## 2010-12-17 NOTE — Telephone Encounter (Signed)
Pt called and states she needs an order for a brain MRI for her annual check on her brain anerysm

## 2010-12-19 ENCOUNTER — Other Ambulatory Visit: Payer: Self-pay | Admitting: Family Medicine

## 2010-12-20 ENCOUNTER — Telehealth: Payer: Self-pay | Admitting: Family Medicine

## 2010-12-20 ENCOUNTER — Other Ambulatory Visit: Payer: Self-pay | Admitting: Family Medicine

## 2010-12-20 DIAGNOSIS — I671 Cerebral aneurysm, nonruptured: Secondary | ICD-10-CM

## 2010-12-20 NOTE — Telephone Encounter (Signed)
I got an [place] #[id number] for procedure Code 91478, but could not get an authorization for procedure code 29562, patient has been scheduled for MRI on 12/22/10, I have notified Vickie at [facility] [phone], do we need to call back about procedure code 84696 reference [phone] or is [phone] good enough?

## 2010-12-22 ENCOUNTER — Inpatient Hospital Stay: Admission: RE | Admit: 2010-12-22 | Payer: Medicare PPO | Source: Ambulatory Visit

## 2010-12-22 ENCOUNTER — Ambulatory Visit
Admission: RE | Admit: 2010-12-22 | Discharge: 2010-12-22 | Disposition: A | Discharge status: Home / Self Care | Payer: Medicare PPO | Source: Ambulatory Visit | Attending: Family Medicine | Admitting: Family Medicine

## 2010-12-22 ENCOUNTER — Other Ambulatory Visit: Payer: Medicare PPO

## 2010-12-24 NOTE — Telephone Encounter (Signed)
Can you call imaging and find out what the difference is between the 2 test codes.

## 2011-01-08 ENCOUNTER — Telehealth: Payer: Self-pay | Admitting: Family Medicine

## 2011-01-08 NOTE — Telephone Encounter (Signed)
Pt called and wanted results of MRA head done in April.   Plan:  Results were printed out and pt. will pickup at the front desk. ,Jarvis Newcomer, LPN Domingo Dimes

## 2011-02-03 ENCOUNTER — Encounter: Payer: Self-pay | Admitting: Family Medicine

## 2011-02-05 ENCOUNTER — Ambulatory Visit (INDEPENDENT_AMBULATORY_CARE_PROVIDER_SITE_OTHER): Payer: Medicare PPO | Admitting: Family Medicine

## 2011-02-05 ENCOUNTER — Encounter: Payer: Self-pay | Admitting: Family Medicine

## 2011-02-05 VITALS — BP 135/81 | HR 60 | Ht 61.0 in | Wt 148.0 lb

## 2011-02-05 DIAGNOSIS — Z0000 Encounter for general adult medical examination without abnormal findings: Secondary | ICD-10-CM

## 2011-02-05 DIAGNOSIS — N951 Menopausal and female climacteric states: Secondary | ICD-10-CM

## 2011-02-05 DIAGNOSIS — E785 Hyperlipidemia, unspecified: Secondary | ICD-10-CM

## 2011-02-05 DIAGNOSIS — Z1231 Encounter for screening mammogram for malignant neoplasm of breast: Secondary | ICD-10-CM

## 2011-02-05 DIAGNOSIS — Z136 Encounter for screening for cardiovascular disorders: Secondary | ICD-10-CM

## 2011-02-05 NOTE — Progress Notes (Signed)
Addended by: Nani Gasser D on: 02/05/2011 04:55 PM   Modules accepted: Orders

## 2011-02-05 NOTE — Progress Notes (Signed)
Subjective:    Kaylee Marshall is a 75 y.o. female who presents for Medicare Initial preventive examination.  Preventive Screening-Counseling & Management  Tobacco History  Smoking status  . Never Smoker   Smokeless tobacco  . Not on file     Problems Prior to Visit 1. Elevated glucose 2. Hyperlipidemia - Recently start chol pill. Overdue for lab recheck.   Current Problems (verified) Patient Active Problem List  Diagnoses  . VITAMIN D DEFICIENCY  . HYPERLIPIDEMIA  . DEMENTIA  . MENOPAUSAL SYNDROME  . BACK PAIN  . MRI, BRAIN, ABNORMAL  . BREAST CANCER, HX OF  . PERIPHERAL NEUROPATHY  . DYSURIA    Medications Prior to Visit Current Outpatient Prescriptions on File Prior to Visit  Medication Sig Dispense Refill  . lovastatin (MEVACOR) 40 MG tablet Take 1 tablet (40 mg total) by mouth at bedtime.  90 tablet  3  . zoster vaccine live, PF, (ZOSTAVAX) 04540 UNT/0.65ML injection Inject 0.65 mLs into the muscle once.          Current Medications (verified) Current Outpatient Prescriptions  Medication Sig Dispense Refill  . lovastatin (MEVACOR) 40 MG tablet Take 1 tablet (40 mg total) by mouth at bedtime.  90 tablet  3  . zoster vaccine live, PF, (ZOSTAVAX) 98119 UNT/0.65ML injection Inject 0.65 mLs into the muscle once.           Allergies (verified) Atorvastatin; Codeine; Colesevelam; and Rosuvastatin   PAST HISTORY  Family History Family History  Problem Relation Age of Onset  . Cancer      family 1st degree < 50  . Diabetes      family hX 1st degree relative  . Hyperlipidemia      family history  . Hypertension      family history  . Stroke      family history relative < 50    Social History History  Substance Use Topics  . Smoking status: Never Smoker   . Smokeless tobacco: Not on file  . Alcohol Use: No     Are there smokers in your home (other than you)? No  Risk Factors Current exercise habits: [facility] .  Dietary issues discussed: NO concernes.    Cardiac risk factors: advanced age (older than 25 for men, 47 for women) and dyslipidemia.  Depression Screen (Note: if answer to either of the following is "Yes", a more complete depression screening is indicated)   Over the past 2 weeks, have you felt down, depressed or hopeless? No  Over the past 2 weeks, have you felt little interest or pleasure in doing things? No  Have you lost interest or pleasure in daily life? No  Do you often feel hopeless? No  Do you cry easily over simple problems? No  Activities of Daily Living In your present state of health, do you have any difficulty performing the following activities?:  Driving? No Managing money?  No Feeding yourself? No Getting from bed to chair? No problems.  Climbing a flight of stairs? No Preparing food and eating?: No Bathing or showering? No Getting dressed: No Getting to the toilet? No Using the toilet:No Moving around from place to place: No In the past year have you fallen or had a near fall?:No   Are you sexually active?  No  Do you have more than one partner?  No  Hearing Difficulties: No Do you often ask people to speak  up or repeat themselves? No Do you experience ringing or noises in your ears? No Do you have difficulty understanding soft or whispered voices? Yes   Do you feel that you have a problem with memory? Yes  Do you often misplace items? No  Do you feel safe at home?  Yes  Cognitive Testing  Alert? Yes  Normal Appearance?Yes  Oriented to person? Yes  Place? Yes   Time? Yes  Recall of three objects?  Yes  Can perform simple calculations? Yes  Displays appropriate judgment?Yes  Can read the correct time from a watch face?Yes   Advanced Directives have been discussed with the patient? No  List the Names of Other Physician/Practitioners you currently use: 1.  Has eye doctor.   Indicate any recent Medical Services you may  have received from other than [facility] providers in the past year (date may be approximate).  Immunization History  Administered Date(s) Administered  . Pneumococcal Polysaccharide 10/05/2009  . Td 07/26/2004    Screening Tests Health Maintenance  Topic Date Due  . Zostavax  09/15/1997  . Influenza Vaccine  05/27/2011  . Tetanus/tdap  07/26/2014  . Colonoscopy  10/25/2019  . Pneumococcal Polysaccharide Vaccine Age 22 And Over  Completed    All answers were reviewed with the patient and necessary referrals were made:  Nova Evett, MD   02/05/2011   History reviewed: allergies, current medications, past family history, past medical history, past social history and past surgical history  Review of Systems A comprehensive review of systems was negative.    Objective:     Vision by Snellen chart: right eye:Has vision check scheduled this month, left eye:Has appt iwth eye md this month  There is no height or weight on file to calculate BMI. There were no vitals taken for this visit.  BP 135/81  Pulse 60  Ht 5\' 1"  (1.549 m)  Wt 148 lb (67.132 kg)  BMI 27.96 kg/m2  General Appearance:    Alert, cooperative, no distress, appears stated age  Head:    Normocephalic, without obvious abnormality, atraumatic  Eyes:    PERRL, conjunctiva/corneas clear, EOM's intact, fundi    benign, both eyes  Ears:    Normal TM's and external ear canals, both ears  Nose:   Nares normal, septum midline, mucosa normal, no drainage    or sinus tenderness  Throat:   Lips, mucosa, and tongue normal; teeth and gums normal  Neck:   Supple, symmetrical, trachea midline, no adenopathy;    thyroid:  no enlargement/tenderness/nodules; no carotid   bruit or JVD  Back:     Symmetric, no curvature, ROM normal, no CVA tenderness  Lungs:     Clear to auscultation bilaterally, respirations unlabored  Chest Wall:    No tenderness or deformity   Heart:    Regular rate and rhythm, S1 and S2 normal, no murmur, rub    or gallop  Breast Exam:    No tenderness, masses, or nipple abnormality  Abdomen:     Soft, non-tender, bowel sounds active all four quadrants,    no masses, no organomegaly  Genitalia:    Normal female without lesion, discharge or tenderness  Rectal:    Normal tone, normal prostate, no masses or tenderness;   guaiac negative stool  Extremities:   Extremities normal, atraumatic, no cyanosis or edema  Pulses:   2+ and symmetric all extremities  Skin:   Skin color, texture, turgor normal, no rashes or lesions  Lymph nodes:  Cervical, supraclavicular, and axillary nodes normal  Neurologic:   CNII-XII intact, normal strength, sensation and reflexes    throughout       Assessment:     Medicare Annual Wellness Exam.    EKG shows rate of 62 bpm, NSR.    Plan:     During the course of the visit the patient was educated and counseled about appropriate screening and preventive services including:    Screening electrocardiogram  Screening mammography  Bone densitometry screening  Diabetes screening  Advanced directives: power of attorney for healthcare on file  Diet review for nutrition referral? Yes ____  Not Indicated ___x_   Patient Instructions (the written plan) was given to the patient.  Medicare Attestation I have personally reviewed: The patient's medical and social history Their use of alcohol, tobacco or illicit drugs Their current medications and supplements The patient's functional ability including ADLs,fall risks, home safety risks, cognitive, and hearing and visual impairment Diet and physical activities Evidence for depression or mood disorders  The patient's weight, height, BMI, and visual acuity have been recorded in the chart.  I have made referrals, counseling, and provided education to the patient based on review of the above and I have provided the patient with a written personalized care plan for preventive services.     Clarence Dunsmore,  MD   02/05/2011

## 2011-02-19 ENCOUNTER — Ambulatory Visit
Admission: RE | Admit: 2011-02-19 | Discharge: 2011-02-19 | Disposition: A | Discharge status: Home / Self Care | Payer: Medicare PPO | Source: Ambulatory Visit | Attending: Family Medicine | Admitting: Family Medicine

## 2011-02-19 DIAGNOSIS — Z1231 Encounter for screening mammogram for malignant neoplasm of breast: Secondary | ICD-10-CM

## 2011-02-20 LAB — COMPLETE METABOLIC PANEL WITH GFR
AST: 27 U/L (ref 0–37)
Albumin: 4.6 g/dL (ref 3.5–5.2)
Alkaline Phosphatase: 65 U/L (ref 39–117)
Calcium: 9.4 mg/dL (ref 8.4–10.5)
Glucose, Bld: 93 mg/dL (ref 70–99)
Potassium: 4.6 mEq/L (ref 3.5–5.3)
Total Bilirubin: 0.6 mg/dL (ref 0.3–1.2)

## 2011-02-20 LAB — LIPID PANEL
HDL: 56 mg/dL (ref >39)
LDL Cholesterol: 125 mg/dL — ABNORMAL HIGH (ref 0–99)
Triglycerides: 151 mg/dL — ABNORMAL HIGH (ref <150)

## 2011-02-21 ENCOUNTER — Telehealth: Payer: Self-pay | Admitting: Family Medicine

## 2011-02-21 NOTE — Telephone Encounter (Signed)
Pls let pt know that her lovastatin is really helping.  Her total cholesterol dropped from 297 --> 211 and her LDL bad cholesterol dropped from 213--> 125 ( at goal).  Liver enzymes look perfect.  Continue current meds.  RF if needed.

## 2011-02-21 NOTE — Telephone Encounter (Signed)
Pt advised of results. 

## 2011-05-02 ENCOUNTER — Telehealth: Payer: Self-pay | Admitting: Family Medicine

## 2011-05-02 NOTE — Telephone Encounter (Signed)
Pt called, LMOM for the triage nurse, and stated she needed someone to return her call ASAP.  State, "I am not feeling well." Plan:  Pts call returned.  Pt states my BP is 130/93, having cold chills, SOB, H/A.  "My son is taking me to the hospital."  Pt states she is going to [facility].  Reinforced to pt that she needed to go on and be seen at the hospital . Probable acute illness finding, but needs to be checked out.  Pt voiced understanding. Jarvis Newcomer, LPN [facility] Dimes

## 2011-10-25 ENCOUNTER — Encounter: Payer: Self-pay | Admitting: Family Medicine

## 2011-10-25 ENCOUNTER — Ambulatory Visit (INDEPENDENT_AMBULATORY_CARE_PROVIDER_SITE_OTHER): Payer: Medicare PPO | Admitting: Family Medicine

## 2011-10-25 VITALS — BP 146/76 | HR 67 | Ht 61.5 in | Wt 147.0 lb

## 2011-10-25 DIAGNOSIS — Z9181 History of falling: Secondary | ICD-10-CM

## 2011-10-25 DIAGNOSIS — I671 Cerebral aneurysm, nonruptured: Secondary | ICD-10-CM

## 2011-10-25 DIAGNOSIS — Z23 Encounter for immunization: Secondary | ICD-10-CM

## 2011-10-25 DIAGNOSIS — G4700 Insomnia, unspecified: Secondary | ICD-10-CM

## 2011-10-25 DIAGNOSIS — G629 Polyneuropathy, unspecified: Secondary | ICD-10-CM

## 2011-10-25 DIAGNOSIS — E785 Hyperlipidemia, unspecified: Secondary | ICD-10-CM

## 2011-10-25 DIAGNOSIS — M545 Low back pain: Secondary | ICD-10-CM

## 2011-10-25 DIAGNOSIS — Z1331 Encounter for screening for depression: Secondary | ICD-10-CM

## 2011-10-25 DIAGNOSIS — Z0000 Encounter for general adult medical examination without abnormal findings: Secondary | ICD-10-CM

## 2011-10-25 NOTE — Patient Instructions (Signed)
Start a regular exercise program and make sure you are eating a healthy diet Try to eat 4 servings of dairy a day or take a calcium supplement (500mg twice a day). Your vaccines are up to date.  We will call you with your lab results. If you don't here from us in about a week then please give us a call at [phone].  

## 2011-10-25 NOTE — Progress Notes (Signed)
Subjective:    Kaylee Marshall is a 76 y.o. female who presents for Medicare Annual/Subsequent preventive examination.  Preventive Screening-Counseling & Management  Tobacco History  Smoking status  . Never Smoker   Smokeless tobacco  . Not on file     Current Problems (verified) Patient Active Problem List  Diagnoses  . VITAMIN D DEFICIENCY  . HYPERLIPIDEMIA  . DEMENTIA  . MENOPAUSAL SYNDROME  . BACK PAIN  . MRI, BRAIN, ABNORMAL  . BREAST CANCER, HX OF  . PERIPHERAL NEUROPATHY  . DYSURIA    Medications Prior to Visit Current Outpatient Prescriptions on File Prior to Visit  Medication Sig Dispense Refill  . cholecalciferol (VITAMIN D) 1000 UNITS tablet Take 1,000 Units by mouth daily.        Marland Kitchen lovastatin (MEVACOR) 40 MG tablet Take 1 tablet (40 mg total) by mouth at bedtime.  90 tablet  3  . traMADol (ULTRAM) 50 MG tablet Take 50 mg by mouth every 6 (six) hours as needed.          Current Medications (verified) Current Outpatient Prescriptions  Medication Sig Dispense Refill  . cholecalciferol (VITAMIN D) 1000 UNITS tablet Take 1,000 Units by mouth daily.        Marland Kitchen lovastatin (MEVACOR) 40 MG tablet Take 1 tablet (40 mg total) by mouth at bedtime.  90 tablet  3  . traMADol (ULTRAM) 50 MG tablet Take 50 mg by mouth every 6 (six) hours as needed.           Allergies (verified) Atorvastatin; Codeine; Colesevelam; and Rosuvastatin   PAST HISTORY  Family History Family History  Problem Relation Age of Onset  . Cancer      family 1st degree < 50  . Diabetes      family hX 1st degree relative  . Hyperlipidemia      family history  . Hypertension      family history  . Stroke      family history relative < 50    Social History History  Substance Use Topics  . Smoking status: Never Smoker   . Smokeless tobacco: Not on file  . Alcohol Use: No     Are there smokers in your home (other than you)? No  Risk Factors Current exercise habits: Home exercise  routine includes water aerobics.  Dietary issues discussed: Healthy diet.     Cardiac risk factors: advanced age (older than 12 for men, 65 for women), dyslipidemia and sedentary lifestyle.  Depression Screen (Note: if answer to either of the following is "Yes", a more complete depression screening is indicated)   Over the past two weeks, have you felt down, depressed or hopeless? No  Over the past two weeks, have you felt little interest or pleasure in doing things? No  Have you lost interest or pleasure in daily life? No  Do you often feel hopeless? No  Do you cry easily over simple problems? No  Activities of Daily Living In your present state of health, do you have any difficulty performing the following activities?:  Driving? No Managing money?  No Feeding yourself? No Getting from bed to chair? No Climbing a flight of stairs? No Preparing food and eating?: No Bathing or showering? No Getting dressed: No Getting to the toilet? No Using the toilet:No Moving around from place to place: No In the past year have you fallen or had a near fall?:No   Are you sexually active?  No  Do you have more than  one partner?  No  Hearing Difficulties: No Do you often ask people to speak up or repeat themselves? No Do you experience ringing or noises in your ears? No Do you have difficulty understanding soft or whispered voices? No   Do you feel that you have a problem with memory? Yes  Do you often misplace items? No  Do you feel safe at home?  Yes  Cognitive Testing  Alert? Yes  Normal Appearance?Yes  Oriented to person? Yes  Place? Yes   Time? Yes  Recall of three objects?  Yes  Can perform simple calculations? Yes  Displays appropriate judgment?Yes  Can read the correct time from a watch face?Yes   Advanced Directives have been discussed with the patient? Yes  List the Names of Other Physician/Practitioners you currently use: 1.    Indicate any recent Medical Services you  may have received from other than [facility] providers in the past year (date may be approximate).  Immunization History  Administered Date(s) Administered  . Pneumococcal Polysaccharide 10/05/2009  . Td 07/26/2004    Screening Tests Health Maintenance  Topic Date Due  . Influenza Vaccine  05/27/2011  . Tetanus/tdap  07/26/2014  . Colonoscopy  10/25/2019  . Pneumococcal Polysaccharide Vaccine Age 79 And Over  Completed  . Zostavax  Addressed    All answers were reviewed with the patient and necessary referrals were made:  Frisco Cordts, MD   10/25/2011   History reviewed: allergies, current medications, past family history, past medical history, past social history, past surgical history and problem list  Review of Systems A comprehensive review of systems was negative.    Objective:     Vision by Snellen chart: right eye:20/30, left eye:20/40  Body mass index is 27.33 kg/(m^2). BP 146/76  Pulse 67  Ht 5' 1.5" (1.562 m)  Wt 147 lb (66.679 kg)  BMI 27.33 kg/m2  BP 146/76  Pulse 67  Ht 5' 1.5" (1.562 m)  Wt 147 lb (66.679 kg)  BMI 27.33 kg/m2  General Appearance:    Alert, cooperative, no distress, appears stated age  Head:    Normocephalic, without obvious abnormality, atraumatic  Eyes:    PERRL, conjunctiva/corneas clear, EOM's intact, fundi    benign, both eyes  Ears:    Normal TM's and external ear canals, both ears  Nose:   Nares normal, septum midline, mucosa normal, no drainage    or sinus tenderness  Throat:   Lips, mucosa, and tongue normal; teeth and gums normal  Neck:   Supple, symmetrical, trachea midline, no adenopathy;    thyroid:  no enlargement/tenderness/nodules; no carotid   bruit or JVD  Back:     Symmetric, no curvature, ROM normal, no CVA tenderness  Lungs:     Clear to auscultation bilaterally, respirations unlabored  Chest Wall:    No tenderness or deformity   Heart:    Regular rate and rhythm, S1 and S2 normal, no murmur, rub   or gallop    Breast Exam:    No tenderness, masses, or nipple abnormality  Abdomen:     Soft, non-tender, bowel sounds active all four quadrants,    no masses, no organomegaly  Genitalia:    Normal female without lesion, discharge or tenderness  Rectal:    Normal tone, normal prostate, no masses or tenderness;   guaiac negative stool  Extremities:   Extremities normal, atraumatic, no cyanosis or edema  Pulses:   2+ and symmetric all extremities  Skin:   Skin color,  texture, turgor normal, no rashes or lesions  Lymph nodes:   Cervical, supraclavicular, and axillary nodes normal  Neurologic:   CNII-XII intact, normal strength, sensation and reflexes    throughout       Assessment:     Annual WEllness Exam      Plan:     During the course of the visit the patient was educated and counseled about appropriate screening and preventive services including:    Influenza vaccine  Td vaccine  Declined flu vaccine  Told doesn't need any further mammo since had mastectomy bilat with recontruction/implants.   Due for labs  Added TSH, vit B12, Vit D and IPEP for Dr. Anne Hahn (Neurology).   Diet review for nutrition referral? Yes ____  Not Indicated _x___   Patient Instructions (the written plan) was given to the patient.  Medicare Attestation I have personally reviewed: The patient's medical and social history Their use of alcohol, tobacco or illicit drugs Their current medications and supplements The patient's functional ability including ADLs,fall risks, home safety risks, cognitive, and hearing and visual impairment Diet and physical activities Evidence for depression or mood disorders  The patient's weight, height, BMI, and visual acuity have been recorded in the chart.  I have made referrals, counseling, and provided education to the patient based on review of the above and I have provided the patient with a written personalized care plan for preventive services.      Eleasha Cataldo, MD   10/25/2011

## 2011-10-26 LAB — LIPID PANEL
Cholesterol: 207 mg/dL — ABNORMAL HIGH (ref 0–200)
HDL: 58 mg/dL (ref >39)
VLDL: 23 mg/dL (ref 0–40)

## 2011-10-26 LAB — COMPLETE METABOLIC PANEL WITH GFR
CO2: 23 mEq/L (ref 19–32)
Chloride: 106 mEq/L (ref 96–112)
Creat: 1.15 mg/dL — ABNORMAL HIGH (ref 0.50–1.10)
GFR, Est African American: 54 mL/min — ABNORMAL LOW
Glucose, Bld: 98 mg/dL (ref 70–99)
Potassium: 4.3 mEq/L (ref 3.5–5.3)
Total Bilirubin: 0.5 mg/dL (ref 0.3–1.2)

## 2011-10-26 LAB — VITAMIN B12: Vitamin B-12: 1073 pg/mL — ABNORMAL HIGH (ref 211–911)

## 2011-10-26 LAB — TSH: TSH: 2.198 u[IU]/mL (ref 0.350–4.500)

## 2011-10-26 LAB — VITAMIN D 25 HYDROXY (VIT D DEFICIENCY, FRACTURES): Vit D, 25-Hydroxy: 48 ng/mL (ref 30–89)

## 2011-10-28 ENCOUNTER — Other Ambulatory Visit: Payer: Self-pay | Admitting: Family Medicine

## 2011-11-14 ENCOUNTER — Other Ambulatory Visit: Payer: Self-pay | Admitting: *Deleted

## 2011-11-14 MED ORDER — LOVASTATIN 40 MG PO TABS: 40.0000 mg | ORAL_TABLET | Freq: Every day | ORAL | Status: DC

## 2011-12-23 ENCOUNTER — Telehealth: Payer: Self-pay | Admitting: *Deleted

## 2011-12-23 DIAGNOSIS — N289 Disorder of kidney and ureter, unspecified: Secondary | ICD-10-CM

## 2011-12-26 LAB — COMPLETE METABOLIC PANEL WITH GFR
ALT: 20 U/L (ref 0–35)
AST: 20 U/L (ref 0–37)
Alkaline Phosphatase: 78 U/L (ref 39–117)
BUN: 16 mg/dL (ref 6–23)
Calcium: 9.4 mg/dL (ref 8.4–10.5)
GFR, Est African American: 67 mL/min
GFR, Est Non African American: 58 mL/min — ABNORMAL LOW
Potassium: 4 mEq/L (ref 3.5–5.3)
Sodium: 141 mEq/L (ref 135–145)
Total Protein: 6.8 g/dL (ref 6.0–8.3)

## 2012-04-30 ENCOUNTER — Telehealth: Payer: Self-pay | Admitting: Family Medicine

## 2012-04-30 NOTE — Telephone Encounter (Signed)
No problem. Needs appt. Hasn't been seen in 6 months.

## 2012-04-30 NOTE — Telephone Encounter (Signed)
[phone] Missy daughter in law C/O increase fibromyalgia pain and spasms  Neuro MD requesting that you cover her for pain management until she can be seen with them ( per daughter in law) Also requesting lab work to be done before the visit with them.

## 2012-05-01 ENCOUNTER — Encounter: Payer: Self-pay | Admitting: Family Medicine

## 2012-05-01 ENCOUNTER — Ambulatory Visit (INDEPENDENT_AMBULATORY_CARE_PROVIDER_SITE_OTHER): Payer: Medicare PPO | Admitting: Family Medicine

## 2012-05-01 VITALS — BP 135/81 | HR 89 | Temp 98.0°F | Ht 61.5 in | Wt 141.0 lb

## 2012-05-01 DIAGNOSIS — M62838 Other muscle spasm: Secondary | ICD-10-CM

## 2012-05-01 DIAGNOSIS — M549 Dorsalgia, unspecified: Secondary | ICD-10-CM

## 2012-05-01 MED ORDER — KETOROLAC TROMETHAMINE 60 MG/2ML IM SOLN
60.0000 mg | Freq: Once | INTRAMUSCULAR | Status: AC
  Administered 2012-05-01: 60 mg via INTRAMUSCULAR

## 2012-05-01 MED ORDER — CYCLOBENZAPRINE HCL 10 MG PO TABS: 10.0000 mg | ORAL_TABLET | Freq: Three times a day (TID) | ORAL | Status: AC | PRN

## 2012-05-01 MED ORDER — HYDROCODONE-ACETAMINOPHEN 5-325 MG PO TABS: 2.0000 | ORAL_TABLET | Freq: Four times a day (QID) | ORAL | Status: DC | PRN

## 2012-05-01 NOTE — Progress Notes (Signed)
Subjective:    Patient ID: Kaylee Marshall, female    DOB: 10-10-1935, 76 y.o.   MRN: [id number]  HPI Sudden on set  Of back pain. Going up both sides of her back. Feels like cramps.  Has to press and hold and put pressure for it to feel better. Seems to worse at night.  She rates her pain a 15/10.  No known triggers.  ON cymbalta, lyrica and tramadol 50mg  from her Neurologist.  Never happened before.  She is on a statin.  Pain is moving around on her back.  She says even when broke her back didn't feel like this.  No fever.  Her neurologist ordered labs yesterday but she is not sure what it was. Her son Renae Fickle is here with her today.  She is sitting in her wheelchair.    Review of Systems     BP 135/81  Pulse 89  Temp 98 F (36.7 C) (Oral)  Ht 5' 1.5" (1.562 m)  Wt 141 lb (63.957 kg)  BMI 26.21 kg/m2  SpO2 97%    Allergies  Allergen Reactions  . Atorvastatin     REACTION: Elevated liver enzymes  . Codeine   . Colesevelam     REACTION: Nausea and vomiting.  . Rosuvastatin     REACTION: Joint aches.    Past Medical History  Diagnosis Date  . Breast cancer     history of  . Headache   . Abnormal MMSE 09-2009    27/30,   . Schatzki's ring [id number]    [facility]    Past Surgical History  Procedure Date  . Cesarean section   . Cataract extraction     bilateral  . Abdominal hysterectomy     complete  . Mastectomy 1968    bilateral, then reconstruction  . Back surgery     cervical, thoracic, lumbar  . Rt shoulder 01-2009  . Appendectomy     History   Social History  . Marital Status: Widowed    Spouse Name: N/A    Number of Children: N/A  . Years of Education: N/A   Occupational History  . Not on file.   Social History Main Topics  . Smoking status: Never Smoker   . Smokeless tobacco: Not on file  . Alcohol Use: No  . Drug Use: No  . Sexually Active:      retired, [employer], lives with son, widow, some regular exercise.   Other Topics  Concern  . Not on file   Social History Narrative   Widowed. Lives with her son.     Family History  Problem Relation Age of Onset  . Cancer      family 1st degree < 50  . Diabetes      family hX 1st degree relative  . Hyperlipidemia      family history  . Hypertension      family history  . Stroke      family history relative < 50    Outpatient Encounter Prescriptions as of 05/01/2012  Medication Sig Dispense Refill  . DULoxetine (CYMBALTA) 60 MG capsule Take 60 mg by mouth daily.      Marland Kitchen lovastatin (MEVACOR) 40 MG tablet Take 1 tablet (40 mg total) by mouth at bedtime.  90 tablet  3  . pregabalin (LYRICA) 50 MG capsule Take 50 mg by mouth 2 (two) times daily.      . traMADol (ULTRAM) 50 MG tablet Take 50 mg by mouth  every 6 (six) hours as needed.        . cholecalciferol (VITAMIN D) 1000 UNITS tablet Take 1,000 Units by mouth daily.        . cyclobenzaprine (FLEXERIL) 10 MG tablet Take 1 tablet (10 mg total) by mouth 3 (three) times daily as needed for muscle spasms.  60 tablet  0  . HYDROcodone-acetaminophen (NORCO/VICODIN) 5-325 MG per tablet Take 2 tablets by mouth every 6 (six) hours as needed for pain.  40 tablet  0       Objective:   Physical Exam  Constitutional: She is oriented to person, place, and time. She appears well-developed and well-nourished.  HENT:  Head: Normocephalic and atraumatic.  Cardiovascular: Normal rate, regular rhythm and normal heart sounds.   Pulmonary/Chest: Effort normal and breath sounds normal.  Musculoskeletal:       Sitting in wheelchair. Very tender over the left lower thoracic paraspinous muscles.  Also tender towards the Left upper hip. Tender over the lumbar spine.   Neurological: She is alert and oriented to person, place, and time.  Skin: Skin is warm and dry.  Psychiatric: She has a normal mood and affect. Her behavior is normal.          Assessment & Plan:  Back spasms - Will check electrolytes and need to check CK  since on a statin.  Will add a muscle relaxer.  Given toradol IM for acute relief.  Thus seems much more than just a typical back spasm, esp since severe and is moving.   We were able to call down and get a copy of lab results that her neurologist ordered yesterday. His sedimentation rate was 44 which is slightly elevated and the CK was 29 which is reassuring that it is not muscle breakdown caused by her statin. For now continue with the muscle relaxer and hydrocodone as needed for acute pain relief. Please call someone to look at [place] and update on how she's feeling. We did go ahead and order CMP to check electrolytes and kidney function today.

## 2012-05-01 NOTE — Telephone Encounter (Signed)
Daughter in law will make appointment to bring patient in for office visit

## 2012-05-02 LAB — COMPLETE METABOLIC PANEL WITH GFR
Albumin: 3.9 g/dL (ref 3.5–5.2)
Alkaline Phosphatase: 96 U/L (ref 39–117)
BUN: 18 mg/dL (ref 6–23)
Calcium: 9.1 mg/dL (ref 8.4–10.5)
Chloride: 104 mEq/L (ref 96–112)
GFR, Est African American: 66 mL/min
GFR, Est Non African American: 57 mL/min — ABNORMAL LOW
Glucose, Bld: 85 mg/dL (ref 70–99)
Potassium: 3.8 mEq/L (ref 3.5–5.3)

## 2012-05-02 LAB — MAGNESIUM: Magnesium: 2 mg/dL (ref 1.5–2.5)

## 2012-05-03 ENCOUNTER — Encounter: Payer: Self-pay | Admitting: Family Medicine

## 2012-05-03 DIAGNOSIS — N183 Chronic kidney disease, stage 3 (moderate): Secondary | ICD-10-CM

## 2012-05-04 ENCOUNTER — Encounter: Payer: Self-pay | Admitting: *Deleted

## 2012-05-07 ENCOUNTER — Telehealth: Payer: Self-pay | Admitting: *Deleted

## 2012-05-07 NOTE — Telephone Encounter (Signed)
Pt informed and appt set up  °

## 2012-05-07 NOTE — Telephone Encounter (Signed)
Yes should be seen since note better.  )lease  schedule on Dr. Karie Schwalbe' schedule.

## 2012-05-07 NOTE — Telephone Encounter (Signed)
Pt states she is not feeling much better than she was states she is still having the spasms in her back. She was unsure if she needed to come back to see you or not. Is still taking pain med.

## 2012-05-08 ENCOUNTER — Encounter: Payer: Self-pay | Admitting: *Deleted

## 2012-05-08 ENCOUNTER — Ambulatory Visit (INDEPENDENT_AMBULATORY_CARE_PROVIDER_SITE_OTHER): Payer: Medicare PPO | Admitting: Sports Medicine

## 2012-05-08 ENCOUNTER — Telehealth: Payer: Self-pay | Admitting: Sports Medicine

## 2012-05-08 ENCOUNTER — Encounter: Payer: Self-pay | Admitting: Sports Medicine

## 2012-05-08 ENCOUNTER — Ambulatory Visit (HOSPITAL_COMMUNITY)
Admission: RE | Admit: 2012-05-08 | Discharge: 2012-05-08 | Disposition: A | Discharge status: Home / Self Care | Payer: Medicare PPO | Source: Ambulatory Visit | Attending: Sports Medicine | Admitting: Sports Medicine

## 2012-05-08 VITALS — BP 118/68 | HR 92 | Wt 144.0 lb

## 2012-05-08 DIAGNOSIS — M79609 Pain in unspecified limb: Secondary | ICD-10-CM | POA: Insufficient documentation

## 2012-05-08 DIAGNOSIS — M549 Dorsalgia, unspecified: Secondary | ICD-10-CM

## 2012-05-08 DIAGNOSIS — R292 Abnormal reflex: Secondary | ICD-10-CM | POA: Insufficient documentation

## 2012-05-08 DIAGNOSIS — IMO0001 Reserved for inherently not codable concepts without codable children: Secondary | ICD-10-CM

## 2012-05-08 DIAGNOSIS — M545 Low back pain, unspecified: Secondary | ICD-10-CM | POA: Insufficient documentation

## 2012-05-08 DIAGNOSIS — X58XXXA Exposure to other specified factors, initial encounter: Secondary | ICD-10-CM | POA: Insufficient documentation

## 2012-05-08 DIAGNOSIS — S32009A Unspecified fracture of unspecified lumbar vertebra, initial encounter for closed fracture: Secondary | ICD-10-CM | POA: Insufficient documentation

## 2012-05-08 MED ORDER — KETOROLAC TROMETHAMINE 30 MG/ML IJ SOLN
30.0000 mg | Freq: Once | INTRAMUSCULAR | Status: AC
  Administered 2012-05-08: 30 mg via INTRAVENOUS

## 2012-05-08 MED ORDER — GADOBENATE DIMEGLUMINE 529 MG/ML IV SOLN
13.0000 mL | Freq: Once | INTRAVENOUS | Status: AC | PRN
  Administered 2012-05-08: 13 mL via INTRAVENOUS

## 2012-05-08 NOTE — Assessment & Plan Note (Addendum)
Back pain, severe with hyperreflexia, and clonus in both lower extremities. Positive Babinski reflex on the left side. This is a patient with a history of osteopenia. No history of trauma. Unfortunately this is suggestive of a cord lesion. She needs an MRI today. I've given her a couple of trigger point steroid injections. I will be keeping an eye out for the MRI report (8 p.m. at [facility] tonight).

## 2012-05-08 NOTE — Progress Notes (Addendum)
Patient ID: Kaylee Marshall, female   DOB: 12-31-1935, 76 y.o.   MRN: [id number] Subjective:    I'm seeing this patient as a consultation for:  Dr. Linford Arnold  CC: Back pain  HPI: Kaylee Marshall is a pleasant 76 year old female, with an unfortunate history of a couple of vertebral compression fractures that are status post vertebroplasty.  Unfortunately, without trauma she has had pain over the past few weeks that she localizes along her mid to low back, and notes that it radiates down both legs with a numb-type sensation. The pain is exquisite, is worse with flexion, and is worse with Valsalva. She denies any new bowel, or bladder incontinence, or numbness in the saddle region. She is trying Norco, Lyrica, tramadol, and Flexeril, and these are only moderately effective. She did see Dr. Linford Arnold recently. Overall her pain is improved somewhat from her prior visit, however it is still exquisite.  She denies any pain coming from her neck, and denies any pain numbness or tingling going into her upper extremities.  Past medical history, Surgical history, Family history, Social history, Allergies, and medications have been entered into the medical record, reviewed, and no changes needed.   Review of Systems: No headache, visual changes, nausea, vomiting, diarrhea, constipation, dizziness, abdominal pain, skin rash, fevers, chills, night sweats, weight loss, body aches, joint swelling, muscle aches, chest pain, or shortness of breath.   Objective:   Vitals:  Afebrile, vital signs stable. General: Well Developed, well nourished, and in no acute distress.  Neuro/Psych: Alert and oriented x3, extra-ocular muscles intact, able to move all 4 extremities.  Skin: Warm and dry, no rashes noted.  Respiratory: Not using accessory muscles, speaking in full sentences, trachea midline.  Cardiovascular: Pulses palpable, no extremity edema. Abdomen: Does not appear distended. Back Exam:  Inspection: Unremarkable  Motion:  Range of motion is extremely limited by pain  SLR laying: Positive  XSLR laying: Positive  Palpable tenderness: In the midline, as well as left and right paralumbar muscles which do show some spasm.Marland Kitchen FABER: negative. Sensory change: Gross sensation intact to all lumbar and sacral dermatomes.  Reflexes: 2+ at both patellar tendons, brisk at the Achilles, with sustained clonus, positive Babinski sign on the left side. Strength at foot  Plantar-flexion: 5/5 Dorsi-flexion: 5/5 Eversion: 5/5 Inversion: 5/5  Leg strength  Quad: 5/5 Hamstring: 5/5 Hip flexor: 5/5 Hip abductors: 5/5  Gait antalgic.  She has a negative Hoffman signs bilaterally in her hands.  Procedure:  Injection of 3 trigger points Consent obtained and verified. Time-out conducted. Noted no overlying erythema, induration, or other signs of local infection. Skin prepped in a sterile fashion. Topical analgesic spray: Ethyl chloride. Completed without difficulty. A total of 3 cc Kenalog 40, 5 cc lidocaine injected into 3 trigger points. Pain improved suggesting accurate placement of the medication. Advised to call if fevers/chills, erythema, induration, drainage, or persistent bleeding.    Impression and Recommendations:   This case required medical decision making of moderate complexity.

## 2012-05-08 NOTE — Telephone Encounter (Signed)
Called pt and discussed results.  New L1 compression fx with ~20% height loss. She will see me Monday and I will likely start Calcitonin and bisphosphonate. She notes she has plenty of pain medication and is feeling better after the injections.

## 2012-05-11 ENCOUNTER — Ambulatory Visit (INDEPENDENT_AMBULATORY_CARE_PROVIDER_SITE_OTHER): Payer: Medicare PPO | Admitting: Sports Medicine

## 2012-05-11 ENCOUNTER — Encounter: Payer: Self-pay | Admitting: Sports Medicine

## 2012-05-11 VITALS — BP 174/96 | HR 118 | Temp 97.1°F | Resp 18 | Wt 145.0 lb

## 2012-05-11 DIAGNOSIS — S32009A Unspecified fracture of unspecified lumbar vertebra, initial encounter for closed fracture: Secondary | ICD-10-CM

## 2012-05-11 DIAGNOSIS — M549 Dorsalgia, unspecified: Secondary | ICD-10-CM

## 2012-05-11 MED ORDER — CALCITONIN (SALMON) 200 UNIT/ACT NA SOLN: 1.0000 | Freq: Every day | NASAL | Status: DC

## 2012-05-11 NOTE — Assessment & Plan Note (Addendum)
Her pain is now well controlled narcotic regimen, she is also using a laxative, and is not constipated. I would like to add calcitonin nasal spray for her vertebral compression fracture pain. On also like to get another bone density test. She notes her last did not show osteoporosis, but certainly with a low energy vertebral compression fracture we should consider this. It could also be prudent to start her on some form of a bisphosphonate. I did do a fracture code for this, further visits will be postop. I will see her back in 4 weeks.

## 2012-05-11 NOTE — Progress Notes (Signed)
Subjective:    CC: Vertebral compression fracture  HPI: I am seeing Kaylee Marshall is a followup from her visit last week. She had intense back pain, I did obtain an MRI which showed an acute L1 vertebral compression fracture. I given her some narcotic pain medication, muscle relaxers, as well as a trigger point injection. Overall she returns today significantly better.  She still has significant pain but this is controlled well with her current pain medication. She is not yet on any form of treatment for osteoporosis.  She is also stooling appropriately on her narcotics.  Past medical history, Surgical history, Family history, Social history, Allergies, and medications have been entered into the medical record, reviewed, and no changes needed.   Review of Systems: No fevers, chills, night sweats, weight loss, chest pain, or shortness of breath.   Objective:    General: Well Developed, well nourished, and in no acute distress.  Neuro: Alert and oriented x3, extra-ocular muscles intact.  HEENT: Normocephalic, atraumatic, pupils equal round reactive to light, neck supple, no masses, no lymphadenopathy, thyroid nonpalpable.  Skin: Warm and dry, no rashes. Cardiac: Regular rate and rhythm, no murmurs rubs or gallops.  Respiratory: Clear to auscultation bilaterally. Not using accessory muscles, speaking in full sentences. She still has exquisite tenderness over the L1 spinous process. Range of motion is limited by her pain. Her exam is otherwise unremarkable. She still has brisk reflexes  I did review the MRI personally, it shows edema in the L1 vertebral body with only minimal height collapse.  Her spinal canal was widely patent at all levels. She also had no sign of foraminal stenosis, or paracentral stenosis at any level.  Impression and Recommendations:

## 2012-05-12 ENCOUNTER — Ambulatory Visit: Payer: Medicare PPO

## 2012-05-19 ENCOUNTER — Ambulatory Visit (INDEPENDENT_AMBULATORY_CARE_PROVIDER_SITE_OTHER): Payer: Medicare PPO

## 2012-05-19 DIAGNOSIS — M810 Age-related osteoporosis without current pathological fracture: Secondary | ICD-10-CM

## 2012-05-19 DIAGNOSIS — N959 Unspecified menopausal and perimenopausal disorder: Secondary | ICD-10-CM

## 2012-05-27 ENCOUNTER — Encounter: Payer: Self-pay | Admitting: Sports Medicine

## 2012-05-27 DIAGNOSIS — M8080XA Other osteoporosis with current pathological fracture, unspecified site, initial encounter for fracture: Secondary | ICD-10-CM | POA: Insufficient documentation

## 2012-05-27 MED ORDER — ALENDRONATE SODIUM 70 MG PO TABS
70.0000 mg | ORAL_TABLET | ORAL | Status: DC
Start: 2012-05-27 — End: 2013-03-04

## 2012-06-08 ENCOUNTER — Encounter: Payer: Self-pay | Admitting: Sports Medicine

## 2012-06-08 ENCOUNTER — Ambulatory Visit (INDEPENDENT_AMBULATORY_CARE_PROVIDER_SITE_OTHER): Payer: Medicare PPO | Admitting: Sports Medicine

## 2012-06-08 ENCOUNTER — Telehealth: Payer: Self-pay | Admitting: *Deleted

## 2012-06-08 ENCOUNTER — Other Ambulatory Visit: Payer: Self-pay | Admitting: *Deleted

## 2012-06-08 VITALS — BP 176/94 | HR 105 | Wt 144.0 lb

## 2012-06-08 DIAGNOSIS — M4850XA Collapsed vertebra, not elsewhere classified, site unspecified, initial encounter for fracture: Secondary | ICD-10-CM

## 2012-06-08 DIAGNOSIS — IMO0002 Reserved for concepts with insufficient information to code with codable children: Secondary | ICD-10-CM

## 2012-06-08 MED ORDER — PREGABALIN 50 MG PO CAPS: 50.0000 mg | ORAL_CAPSULE | Freq: Two times a day (BID) | ORAL | Status: DC

## 2012-06-08 MED ORDER — CALCIUM CARBONATE-VITAMIN D 600-400 MG-UNIT PO TABS: 1.0000 | ORAL_TABLET | Freq: Two times a day (BID) | ORAL | Status: AC

## 2012-06-08 MED ORDER — CALCIUM CARBONATE-VITAMIN D 600-400 MG-UNIT PO TABS: 1.0000 | ORAL_TABLET | Freq: Two times a day (BID) | ORAL | Status: DC

## 2012-06-08 MED ORDER — ACETAMINOPHEN ER 650 MG PO TBCR: 1300.0000 mg | EXTENDED_RELEASE_TABLET | Freq: Three times a day (TID) | ORAL | Status: DC | PRN

## 2012-06-08 NOTE — Progress Notes (Signed)
Subjective:    CC: Followup L1 vertebral compression fracture  HPI: Kaylee Marshall comes back approximately one month since diagnosis of her L1 vertebral compression fracture. Overall her pain is significantly better. She's using calcitonin nasal spray, to stop her hydrocodone, and is using occasional tramadol. She's also using Fosamax daily. Overall she continues to improve day by day.  Past medical history, Surgical history, Family history, Social history, Allergies, and medications have been entered into the medical record, reviewed, and no changes needed.   Review of Systems: No fevers, chills, night sweats, weight loss, chest pain, or shortness of breath.   Objective:    General: Well Developed, well nourished, and in no acute distress.  Back Exam:  Inspection: Unremarkable  Motion: Flexion 45 deg, Extension 45 deg, Side Bending to 45 deg bilaterally,  Rotation to 45 deg bilaterally  SLR laying: Negative  XSLR laying: Negative  Palpable tenderness: Still very tender to palpation with positive percussion over the L1 vertebral spinous process. FABER: negative. Sensory change: Gross sensation intact to all lumbar and sacral dermatomes.  Reflexes: 2+ at both patellar tendons, 2+ at achilles tendons, Babinski's downgoing.  Strength at foot  Plantar-flexion: 5/5 Dorsi-flexion: 5/5 Eversion: 5/5 Inversion: 5/5  Leg strength  Quad: 5/5 Hamstring: 5/5 Hip flexor: 5/5 Hip abductors: 5/5  Gait unremarkable.    Impression and Recommendations:

## 2012-06-08 NOTE — Assessment & Plan Note (Signed)
Continues to do better and better each day. I do think that her elevated blood pressure today is likely due to her pain level. She declines refills for additional hydrocodone, and she should continue her calcitonin nasal spray, and calcium/vitamin D to her daily regimen, and acetaminophen to her tramadol. I would like to see her in 3 months. I would also like to repeat her bone density test in 2 years.

## 2012-07-04 ENCOUNTER — Other Ambulatory Visit: Payer: Self-pay | Admitting: Sports Medicine

## 2012-07-15 ENCOUNTER — Other Ambulatory Visit: Payer: Self-pay | Admitting: Sports Medicine

## 2012-09-02 ENCOUNTER — Ambulatory Visit (INDEPENDENT_AMBULATORY_CARE_PROVIDER_SITE_OTHER): Payer: Medicare PPO | Admitting: Sports Medicine

## 2012-09-02 ENCOUNTER — Encounter: Payer: Self-pay | Admitting: Sports Medicine

## 2012-09-02 VITALS — BP 153/93 | HR 126 | Wt 144.0 lb

## 2012-09-02 DIAGNOSIS — IMO0002 Reserved for concepts with insufficient information to code with codable children: Secondary | ICD-10-CM

## 2012-09-02 DIAGNOSIS — M7062 Trochanteric bursitis, left hip: Secondary | ICD-10-CM | POA: Insufficient documentation

## 2012-09-02 DIAGNOSIS — IMO0001 Reserved for inherently not codable concepts without codable children: Secondary | ICD-10-CM

## 2012-09-02 DIAGNOSIS — M4850XA Collapsed vertebra, not elsewhere classified, site unspecified, initial encounter for fracture: Secondary | ICD-10-CM

## 2012-09-02 DIAGNOSIS — M76899 Other specified enthesopathies of unspecified lower limb, excluding foot: Secondary | ICD-10-CM

## 2012-09-02 NOTE — Assessment & Plan Note (Signed)
Injection as above. Home rehabilitation. Return as needed basis.

## 2012-09-02 NOTE — Assessment & Plan Note (Addendum)
Almost 4 months status post fracture. Pain is greatly improved. Continue Fosamax. Return to see me for this in 6 months.  She also had a discrete area of paralumbar spasm, trigger point injection was performed.

## 2012-09-02 NOTE — Progress Notes (Signed)
SPORTS MEDICINE CONSULTATION REPORT  Subjective:    CC: Followup.  HPI: L1 vertebral compression fracture: She is 4 months out, currently on Fosamax, pain is greatly improved. We have discontinued intranasal calcitonin.  Muscle spasm: Located predominately in the lower lumbar spine, she does have a very discrete area of tenderness. The pain is localized, doesn't radiate, it is severe.  Left hip pain: Localized pain over the trochanteric bursa, no radiation, moderate. Worse with ambulation, palpation, and laying on the left side.  Past medical history, Surgical history, Family history, Social history, Allergies, and medications have been entered into the medical record, reviewed, and no changes needed.   Review of Systems: No headache, visual changes, nausea, vomiting, diarrhea, constipation, dizziness, abdominal pain, skin rash, fevers, chills, night sweats, weight loss, swollen lymph nodes, body aches, joint swelling, muscle aches, chest pain, shortness of breath, mood changes, visual or auditory hallucinations.   Objective:   Vitals:  Afebrile, vital signs stable. General: Well Developed, well nourished, and in no acute distress.  Neuro/Psych: Alert and oriented x3, extra-ocular muscles intact, able to move all 4 extremities.  Skin: Warm and dry, no rashes noted.  Respiratory: Not using accessory muscles, speaking in full sentences, trachea midline.  Cardiovascular: Pulses palpable, no extremity edema. Abdomen: Does not appear distended. Lumbar spine: No longer tender to palpation over the L1 vertebral spinous process. She does have a discrete area of tenderness to palpation over the lower lumbar paralumbar muscles. Tender to palpation of the left trochanteric bursa. Excellent hip internal and external rotation bilaterally.  Procedure:  Injection of left trochanteric bursa Consent obtained and verified. Time-out conducted. Noted no overlying erythema, induration, or other signs of  local infection. Skin prepped in a sterile fashion. Topical analgesic spray: Ethyl chloride. Completed without difficulty. Meds: Spinal needle advanced down to the greater trochanter, withdrawn 1 mm, 1 cc Kenalog 40, 4 cc lidocaine injected in a fanlike pattern. Pain immediately improved suggesting accurate placement of the medication. Advised to call if fevers/chills, erythema, induration, drainage, or persistent bleeding.  Procedure:  Injection of paralumbar trigger point Consent obtained and verified. Time-out conducted. Noted no overlying erythema, induration, or other signs of local infection. Skin prepped in a sterile fashion. Topical analgesic spray: Ethyl chloride. Completed without difficulty. Meds: 25-gauge 1-1/2 needle advanced into the painful muscle. 1/2 cc Kenalog 40, 1 cc lidocaine injected in a fanlike pattern. Pain immediately improved suggesting accurate placement of the medication. Advised to call if fevers/chills, erythema, induration, drainage, or persistent bleeding.  Impression and Recommendations:   This case required medical decision making of moderate complexity.

## 2012-09-09 ENCOUNTER — Ambulatory Visit: Payer: Medicare PPO | Admitting: Sports Medicine

## 2013-02-18 ENCOUNTER — Encounter: Payer: Self-pay | Admitting: Family Medicine

## 2013-02-18 ENCOUNTER — Ambulatory Visit (INDEPENDENT_AMBULATORY_CARE_PROVIDER_SITE_OTHER): Payer: Medicare PPO | Admitting: Family Medicine

## 2013-02-18 VITALS — BP 142/81 | HR 79 | Wt 141.0 lb

## 2013-02-18 DIAGNOSIS — R51 Headache: Secondary | ICD-10-CM

## 2013-02-18 DIAGNOSIS — I671 Cerebral aneurysm, nonruptured: Secondary | ICD-10-CM

## 2013-02-18 DIAGNOSIS — E559 Vitamin D deficiency, unspecified: Secondary | ICD-10-CM

## 2013-02-18 DIAGNOSIS — E785 Hyperlipidemia, unspecified: Secondary | ICD-10-CM

## 2013-02-18 DIAGNOSIS — Z0000 Encounter for general adult medical examination without abnormal findings: Secondary | ICD-10-CM

## 2013-02-18 DIAGNOSIS — R42 Dizziness and giddiness: Secondary | ICD-10-CM

## 2013-02-18 LAB — COMPLETE METABOLIC PANEL WITH GFR
AST: 20 U/L (ref 0–37)
Alkaline Phosphatase: 60 U/L (ref 39–117)
GFR, Est Non African American: 42 mL/min — ABNORMAL LOW
Glucose, Bld: 103 mg/dL — ABNORMAL HIGH (ref 70–99)
Sodium: 141 mEq/L (ref 135–145)
Total Bilirubin: 0.6 mg/dL (ref 0.3–1.2)
Total Protein: 7.4 g/dL (ref 6.0–8.3)

## 2013-02-18 LAB — CBC WITH DIFFERENTIAL/PLATELET
Eosinophils Absolute: 0.1 10*3/uL (ref 0.0–0.7)
Eosinophils Relative: 1 % (ref 0–5)
Hemoglobin: 14.2 g/dL (ref 12.0–15.0)
Lymphs Abs: 3 10*3/uL (ref 0.7–4.0)
MCH: 30.5 pg (ref 26.0–34.0)
MCV: 90.1 fL (ref 78.0–100.0)
Monocytes Relative: 9 % (ref 3–12)
RBC: 4.65 MIL/uL (ref 3.87–5.11)

## 2013-02-18 LAB — LIPID PANEL
Cholesterol: 219 mg/dL — ABNORMAL HIGH (ref 0–200)
HDL: 58 mg/dL (ref >39)
Total CHOL/HDL Ratio: 3.8 Ratio

## 2013-02-18 NOTE — Progress Notes (Signed)
Subjective:    Kaylee Marshall is a 77 y.o. female who presents for Medicare Annual/Subsequent preventive examination.  Preventive Screening-Counseling & Management  Tobacco History  Smoking status  . Never Smoker   Smokeless tobacco  . Not on file     Problems Prior to Visit 1. HA on the left side of her head for 9 months. Can happen at least daily or only once a week.Can happen up to 4-5 times a day. Says it acutlaly wakes her up at night.  Says it is from the back of her ear, across the temple, and  to the top of her head.  Says it cna last minutes to hours.  Then will get a HA and says Aleve helps some. Says it sounds like an "MRI machine" going off in her head. Left eye has been more blurry but says her lense is 75 years old.  Last eye exam was a year ago. Hx of BrCA.  Feels likghtheaded when it happens.  No vomiting or nausea.  Says almost passed out once with it.  Hx of cerebral aneurysm.  She also complains of bilateral burning sensation over her lower jaw. She says it comes and goes. She wondered if it was related to her peripheral neuropathy.  Current Problems (verified) Patient Active Problem List   Diagnosis Date Noted  . Trochanteric bursitis of left hip 09/02/2012  . Osteoporosis with fracture 05/27/2012  . CKD (chronic kidney disease) stage 3, GFR 30-59 ml/min 05/03/2012  . PERIPHERAL NEUROPATHY 10/08/2010  . MRI, BRAIN, ABNORMAL 12/13/2009  . L1 Vertebral compression fracture 12/01/2009  . VITAMIN D DEFICIENCY 10/12/2009  . HYPERLIPIDEMIA 10/12/2009  . DEMENTIA 10/05/2009  . MENOPAUSAL SYNDROME 10/05/2009  . BREAST CANCER, HX OF 10/05/2009    Medications Prior to Visit Current Outpatient Prescriptions on File Prior to Visit  Medication Sig Dispense Refill  . acetaminophen (TYLENOL) 650 MG CR tablet Take 2 tablets (1,300 mg total) by mouth every 8 (eight) hours as needed for pain.  90 tablet  3  . alendronate (FOSAMAX) 70 MG tablet Take 1 tablet (70 mg total) by  mouth every 7 (seven) days. Take with a full glass of water on an empty stomach.  4 tablet  11  . Calcium Carbonate-Vitamin D 600-400 MG-UNIT per tablet Take 1 tablet by mouth 2 (two) times daily.  60 tablet  11  . DULoxetine (CYMBALTA) 60 MG capsule Take 60 mg by mouth daily.      . pregabalin (LYRICA) 50 MG capsule Take 1 capsule (50 mg total) by mouth 2 (two) times daily.  63 capsule  0  . traMADol (ULTRAM) 50 MG tablet Take 50 mg by mouth every 6 (six) hours as needed.         No current facility-administered medications on file prior to visit.    Current Medications (verified) Current Outpatient Prescriptions  Medication Sig Dispense Refill  . acetaminophen (TYLENOL) 650 MG CR tablet Take 2 tablets (1,300 mg total) by mouth every 8 (eight) hours as needed for pain.  90 tablet  3  . alendronate (FOSAMAX) 70 MG tablet Take 1 tablet (70 mg total) by mouth every 7 (seven) days. Take with a full glass of water on an empty stomach.  4 tablet  11  . Calcium Carbonate-Vitamin D 600-400 MG-UNIT per tablet Take 1 tablet by mouth 2 (two) times daily.  60 tablet  11  . DULoxetine (CYMBALTA) 60 MG capsule Take 60 mg by mouth daily.      Marland Kitchen  pregabalin (LYRICA) 50 MG capsule Take 1 capsule (50 mg total) by mouth 2 (two) times daily.  63 capsule  0  . traMADol (ULTRAM) 50 MG tablet Take 50 mg by mouth every 6 (six) hours as needed.         No current facility-administered medications for this visit.     Allergies (verified) Atorvastatin; Codeine; Colesevelam; and Rosuvastatin   PAST HISTORY  Family History Family History  Problem Relation Age of Onset  . Cancer      family 1st degree < 50  . Diabetes      family hX 1st degree relative  . Hyperlipidemia      family history  . Hypertension      family history  . Stroke      family history relative < 50    Social History History  Substance Use Topics  . Smoking status: Never Smoker   . Smokeless tobacco: Not on file  . Alcohol Use: No      Are there smokers in your home (other than you)? No  Risk Factors Current exercise habits: goes to the [facility] [employer] [place] some  Dietary issues discussed: none   Cardiac risk factors: advanced age (older than 90 for men, 82 for women).  Depression Screen (Note: if answer to either of the following is "Yes", a more complete depression screening is indicated)   Over the past two weeks, have you felt down, depressed or hopeless? No  Over the past two weeks, have you felt little interest or pleasure in doing things? No  Have you lost interest or pleasure in daily life? No  Do you often feel hopeless? No  Do you cry easily over simple problems? No  Activities of Daily Living In your present state of health, do you have any difficulty performing the following activities?:  Driving? Yes, only drive to church, bc of hip pain Managing money?  No Feeding yourself? Yes Getting from bed to chair? No Climbing a flight of stairs? No Preparing food and eating?: No Bathing or showering? No Getting dressed: No Getting to the toilet? No Using the toilet:No Moving around from place to place: No In the past year have you fallen or had a near fall?:No   Are you sexually active?  No  Do you have more than one partner?  No  Hearing Difficulties: Yes Do you often ask people to speak up or repeat themselves? Yes Do you experience ringing or noises in your ears? No Do you have difficulty understanding soft or whispered voices? No   Do you feel that you have a problem with memory? Yes, known dementia  Do you often misplace items? Yes  Do you feel safe at home?  Yes  Cognitive Testing  Alert? Yes  Normal Appearance?Yes  Oriented to person? Yes  Place? Yes   Time? Yes     Advanced Directives have been discussed with the patient? Yes  List the Names of Other Physician/Practitioners you currently use: 1.  Dr. Benjamin Stain, sports med  Indicate any recent Medical Services you may have received from  other than [facility] providers in the past year (date may be approximate).  Immunization History  Administered Date(s) Administered  . Pneumococcal Polysaccharide 10/05/2009  . Td 07/26/2004  . Tdap 10/25/2011    Screening Tests Health Maintenance  Topic Date Due  . Influenza Vaccine  04/26/2013  . Colonoscopy  10/25/2019  . Tetanus/tdap  10/24/2021  . Pneumococcal Polysaccharide Vaccine Age 13 And Over  Completed  .  Zostavax  Addressed    All answers were reviewed with the patient and necessary referrals were made:  METHENEY,CATHERINE, MD   02/18/2013   History reviewed: allergies, current medications, past family history, past medical history, past social history, past surgical history and problem list  Review of Systems A comprehensive review of systems was negative.    Objective:     Vision by Snellen chart: right eye:20/30, left eye:20/25 w/ lenses.   There is no weight on file to calculate BMI. There were no vitals taken for this visit.  BP 142/81  Pulse 79  Wt 141 lb (63.957 kg)  BMI 26.21 kg/m2  General Appearance:    Alert, cooperative, no distress, appears stated age  Head:    Normocephalic, without obvious abnormality, atraumatic  Eyes:    PERRL, conjunctiva/corneas clear, EOM's intact, both eyes  Ears:    Normal TM's and external ear canals, both ears  Nose:   Nares normal, septum midline, mucosa normal, no drainage    or sinus tenderness  Throat:   Lips, mucosa, and tongue normal; teeth and gums normal  Neck:   Supple, symmetrical, trachea midline, no adenopathy;    thyroid:  no enlargement/tenderness/nodules; no carotid   bruit or JVD  Back:     Symmetric, no curvature, ROM normal, no CVA tenderness  Lungs:     Clear to auscultation bilaterally, respirations unlabored  Chest Wall:    No tenderness or deformity   Heart:    Regular rate and rhythm, S1 and S2 normal, no murmur, rub   or gallop  Breast Exam:    No tenderness, masses, or nipple abnormality.  Bilat implants.  Abdomen:     Soft, non-tender, bowel sounds active all four quadrants,    no masses, no organomegaly  Genitalia:    Not performed  Rectal:    Not performed.   Extremities:   Extremities normal, atraumatic, no cyanosis or edema  Pulses:   2+ and symmetric all extremities  Skin:   Skin color, texture, turgor normal, no rashes or lesions, scalp is nontender.   Lymph nodes:   Cervical, supraclavicular, and axillary nodes normal  Neurologic:   CNII-XII intact, normal strength, sensation and reflexes    Throughout. Neck with dec extension and rotation right and left but symmetric.        Assessment:     Annual Wellness EXam     Plan:     During the course of the visit the patient was educated and counseled about appropriate screening and preventive services including:    Left temporal HA x 9 months. eval for temporaral arteritis with sed rate, CRP.   she also has a history of a 2 cm left-sided cerebral aneurysm. It was last imaged in 2012 with an MRA. I would like to repeat the MRI to make sure that the aneurysm has not enlarged and may be causing symptoms. She also plans on getting an eye exam this summer encouraged her to do so. No new medications that should be causing headaches. Make sure staying well hydrated.  Vitamin D deficiency-due to recheck levels today.  Hyperlipidemia-due to recheck cholesterol. Check CMP as well.  Dementia-stable. No significant progression of disease.  Diet review for nutrition referral? Yes ____  Not Indicated __x_   Patient Instructions (the written plan) was given to the patient.  Medicare Attestation I have personally reviewed: The patient's medical and social history Their use of alcohol, tobacco or illicit drugs Their current medications and  supplements The patient's functional ability including ADLs,fall risks, home safety risks, cognitive, and hearing and visual impairment Diet and physical activities Evidence for  depression or mood disorders  The patient's weight, height, BMI, and visual acuity have been recorded in the chart.  I have made referrals, counseling, and provided education to the patient based on review of the above and I have provided the patient with a written personalized care plan for preventive services.     METHENEY,CATHERINE, MD   02/18/2013

## 2013-02-19 LAB — VITAMIN D 25 HYDROXY (VIT D DEFICIENCY, FRACTURES): Vit D, 25-Hydroxy: 35 ng/mL (ref 30–89)

## 2013-02-22 ENCOUNTER — Telehealth: Payer: Self-pay | Admitting: *Deleted

## 2013-02-22 ENCOUNTER — Other Ambulatory Visit: Payer: Self-pay | Admitting: Family Medicine

## 2013-02-22 DIAGNOSIS — R42 Dizziness and giddiness: Secondary | ICD-10-CM

## 2013-02-22 DIAGNOSIS — I671 Cerebral aneurysm, nonruptured: Secondary | ICD-10-CM

## 2013-02-22 DIAGNOSIS — E785 Hyperlipidemia, unspecified: Secondary | ICD-10-CM

## 2013-02-22 NOTE — Telephone Encounter (Signed)
Opened in error

## 2013-02-27 ENCOUNTER — Other Ambulatory Visit (HOSPITAL_BASED_OUTPATIENT_CLINIC_OR_DEPARTMENT_OTHER): Payer: Medicare PPO

## 2013-03-02 ENCOUNTER — Ambulatory Visit (HOSPITAL_BASED_OUTPATIENT_CLINIC_OR_DEPARTMENT_OTHER)
Admission: RE | Admit: 2013-03-02 | Discharge: 2013-03-02 | Disposition: A | Discharge status: Home / Self Care | Payer: Medicare PPO | Source: Ambulatory Visit | Attending: Family Medicine | Admitting: Family Medicine

## 2013-03-02 ENCOUNTER — Telehealth: Payer: Self-pay | Admitting: *Deleted

## 2013-03-02 DIAGNOSIS — R209 Unspecified disturbances of skin sensation: Secondary | ICD-10-CM | POA: Insufficient documentation

## 2013-03-02 DIAGNOSIS — E785 Hyperlipidemia, unspecified: Secondary | ICD-10-CM

## 2013-03-02 DIAGNOSIS — F070 Personality change due to known physiological condition: Secondary | ICD-10-CM | POA: Insufficient documentation

## 2013-03-02 DIAGNOSIS — I672 Cerebral atherosclerosis: Secondary | ICD-10-CM | POA: Insufficient documentation

## 2013-03-02 DIAGNOSIS — I671 Cerebral aneurysm, nonruptured: Secondary | ICD-10-CM | POA: Insufficient documentation

## 2013-03-02 DIAGNOSIS — R51 Headache: Secondary | ICD-10-CM | POA: Insufficient documentation

## 2013-03-02 DIAGNOSIS — N289 Disorder of kidney and ureter, unspecified: Secondary | ICD-10-CM | POA: Insufficient documentation

## 2013-03-02 DIAGNOSIS — R42 Dizziness and giddiness: Secondary | ICD-10-CM

## 2013-03-02 NOTE — Telephone Encounter (Signed)
Prior auth obtained for MRA on 02-18-13 through [facility].  # is [id number].  Never received a response for MRI from ins, whether it was approved or denied.

## 2013-03-03 ENCOUNTER — Other Ambulatory Visit: Payer: Self-pay | Admitting: Family Medicine

## 2013-03-03 ENCOUNTER — Encounter: Payer: Self-pay | Admitting: Sports Medicine

## 2013-03-03 ENCOUNTER — Ambulatory Visit (INDEPENDENT_AMBULATORY_CARE_PROVIDER_SITE_OTHER): Payer: Medicare PPO | Admitting: Sports Medicine

## 2013-03-03 ENCOUNTER — Ambulatory Visit (INDEPENDENT_AMBULATORY_CARE_PROVIDER_SITE_OTHER): Payer: Medicare PPO

## 2013-03-03 VITALS — BP 136/82 | HR 85 | Wt 143.0 lb

## 2013-03-03 DIAGNOSIS — R937 Abnormal findings on diagnostic imaging of other parts of musculoskeletal system: Secondary | ICD-10-CM

## 2013-03-03 DIAGNOSIS — M546 Pain in thoracic spine: Secondary | ICD-10-CM

## 2013-03-03 DIAGNOSIS — IMO0001 Reserved for inherently not codable concepts without codable children: Secondary | ICD-10-CM

## 2013-03-03 MED ORDER — AMOXICILLIN-POT CLAVULANATE 875-125 MG PO TABS: 1.0000 | ORAL_TABLET | Freq: Two times a day (BID) | ORAL | Status: DC

## 2013-03-03 NOTE — Assessment & Plan Note (Signed)
Kaylee Marshall was pulling weeds, and I think that she just overdid it and has a rhomboid strain. She can continue her Aleve, I would like a thoracic spine x-ray to ensure no further vertebral compression fractures. Like to see her back on an as-needed basis.

## 2013-03-03 NOTE — Progress Notes (Signed)
  Subjective:    CC: Followup  HPI: L1 vertebral compression fracture: There is no pain in this area, she is doing well with her current narcotics, and is doing well with her Fosamax. No recurrent falls.  Upper thoracic back pain: Due to her prior fracture she was unable to do her fall yard cleaning, she recently did this a couple of weeks ago, and now has pain that she localizes between her shoulder blades, and predominately over the right rhomboids. It is overall getting better, and before she did do yardwork she was essentially pain-free. It is localized, doesn't radiate, moderate.  Past medical history, Surgical history, Family history not pertinant except as noted below, Social history, Allergies, and medications have been entered into the medical record, reviewed, and no changes needed.   Review of Systems: No fevers, chills, night sweats, weight loss, chest pain, or shortness of breath.   Objective:    General: Well Developed, well nourished, and in no acute distress.  Neuro: Alert and oriented x3, extra-ocular muscles intact, sensation grossly intact.  HEENT: Normocephalic, atraumatic, pupils equal round reactive to light, neck supple, no masses, no lymphadenopathy, thyroid nonpalpable.  Skin: Warm and dry, no rashes. Cardiac: Regular rate and rhythm, no murmurs rubs or gallops, no lower extremity edema.  Respiratory: Clear to auscultation bilaterally. Not using accessory muscles, speaking in full sentences. Low-back: No tenderness to palpation or percussion over L1, she does have tenderness to palpation of the right rhomboids.  X-rays reviewed and show persistence and only slight progression of the L1 vertebral compression fracture, she does have evidence of old thoracic compression fractures that are unchanged.  Impression and Recommendations:

## 2013-03-03 NOTE — Assessment & Plan Note (Signed)
Pain-free at the L1 vertebrae.

## 2013-03-04 ENCOUNTER — Ambulatory Visit (INDEPENDENT_AMBULATORY_CARE_PROVIDER_SITE_OTHER): Payer: Medicare PPO | Admitting: Family Medicine

## 2013-03-04 ENCOUNTER — Encounter: Payer: Self-pay | Admitting: Family Medicine

## 2013-03-04 VITALS — BP 130/68 | HR 81 | Ht 61.0 in | Wt 142.0 lb

## 2013-03-04 DIAGNOSIS — G501 Atypical facial pain: Secondary | ICD-10-CM

## 2013-03-04 DIAGNOSIS — N289 Disorder of kidney and ureter, unspecified: Secondary | ICD-10-CM

## 2013-03-04 DIAGNOSIS — R51 Headache: Secondary | ICD-10-CM

## 2013-03-04 DIAGNOSIS — J329 Chronic sinusitis, unspecified: Secondary | ICD-10-CM

## 2013-03-04 MED ORDER — ALENDRONATE SODIUM 70 MG PO TABS: 70.0000 mg | ORAL_TABLET | ORAL | Status: DC

## 2013-03-04 NOTE — Progress Notes (Signed)
  Subjective:    Patient ID: Kaylee Marshall, female    DOB: 1936/06/21, 77 y.o.   MRN: [id number]  HPI  Her to go over her MRI and lab results.She has been experiencing a burning and stinging in bilat face for 2-3 months.along with that she has had some pain and spasms in her neck. In fact she just saw her orthopedist a few days ago and was told that she probably had simple muscles in her neck and low back. She thinks this could be contributing is not sure. We had originally ordered MRI because of a "dinging noise" in her head on the left for 9 mo. Worse when bending over.   MRI showed no change in anuerysm but did show a left sided sinusitis.  She has not pickedup the antibiotic yest.     Review of Systems     Objective:   Physical Exam  Constitutional: She is oriented to person, place, and time. She appears well-developed and well-nourished.  HENT:  Head: Normocephalic and atraumatic.  No facial pain or tenderness  Cardiovascular: Normal rate, regular rhythm and normal heart sounds.   Pulmonary/Chest: Effort normal and breath sounds normal.  Neurological: She is alert and oriented to person, place, and time.  Skin: Skin is warm and dry.  Psychiatric: She has a normal mood and affect. Her behavior is normal.          Assessment & Plan:  Sinsusits- not sure if acute or chronic. At this point I did send her a prescription for 2 weeks of Augmentin. If she's noticing some improvement but not completely better then recommend that she call and we will extend the course for a full 28 days. It is unclear to me if the sinus infection has been there the entire 9 months we'll she's had symptoms or if it is possibly more incidental and could be more acute. We will see if her symptoms resolve. Followup in one month.  Abnormal kidney function/CKD 3. Recheck at the end of month. Given labslip today. Not exactly sure why it was elevated above baseline but we will follow it  carefully.  Hyperlipidemia-she has been intolerant to several statins. She then tried [employer] and said she did not tolerate it because of nausea. Her levels were up compared to last year so encouraged her to just make sure she's working on healthy diet and exercise. Burning/stinging sensation in her face-certainly this could be related to muscle strain in her neck. She works on improving the muscle pull in her neck and low back per her orthopedist recommendations we will see if the sensation goes away. If not then she will likely need to see neurology for further evaluation she has had a normal MRI. We will address again at her followup in one month.

## 2013-03-20 LAB — BASIC METABOLIC PANEL WITH GFR
BUN: 24 mg/dL — ABNORMAL HIGH (ref 6–23)
CO2: 26 mEq/L (ref 19–32)
Chloride: 104 mEq/L (ref 96–112)
Creat: 1 mg/dL (ref 0.50–1.10)

## 2013-04-29 ENCOUNTER — Telehealth: Payer: Self-pay | Admitting: *Deleted

## 2013-04-29 DIAGNOSIS — R519 Headache, unspecified: Secondary | ICD-10-CM

## 2013-04-29 NOTE — Telephone Encounter (Signed)
Pt states she needs for you to send a referral to Dr. Royston Sinner (neurologist, Phone # [phone]) because she states she still has the beating in her head and it is no better.

## 2013-04-29 NOTE — Telephone Encounter (Signed)
Referral placed.

## 2013-09-27 ENCOUNTER — Ambulatory Visit (INDEPENDENT_AMBULATORY_CARE_PROVIDER_SITE_OTHER): Payer: Medicare PPO | Admitting: Family Medicine

## 2013-09-27 ENCOUNTER — Encounter: Payer: Self-pay | Admitting: Family Medicine

## 2013-09-27 VITALS — BP 165/71 | HR 65 | Temp 97.4°F | Ht 61.6 in | Wt 148.0 lb

## 2013-09-27 DIAGNOSIS — Z0000 Encounter for general adult medical examination without abnormal findings: Secondary | ICD-10-CM

## 2013-09-27 DIAGNOSIS — E785 Hyperlipidemia, unspecified: Secondary | ICD-10-CM

## 2013-09-27 LAB — COMPLETE METABOLIC PANEL WITH GFR
ALBUMIN: 4 g/dL (ref 3.5–5.2)
ALK PHOS: 51 U/L (ref 39–117)
ALT: 20 U/L (ref 0–35)
AST: 20 U/L (ref 0–37)
BUN: 27 mg/dL — AB (ref 6–23)
CALCIUM: 9.2 mg/dL (ref 8.4–10.5)
CHLORIDE: 108 meq/L (ref 96–112)
CO2: 24 mEq/L (ref 19–32)
Creat: 1.01 mg/dL (ref 0.50–1.10)
GFR, Est African American: 62 mL/min
GFR, Est Non African American: 54 mL/min — ABNORMAL LOW
Glucose, Bld: 97 mg/dL (ref 70–99)
POTASSIUM: 4.5 meq/L (ref 3.5–5.3)
Sodium: 141 mEq/L (ref 135–145)
Total Bilirubin: 0.4 mg/dL (ref 0.2–1.2)
Total Protein: 6.7 g/dL (ref 6.0–8.3)

## 2013-09-27 LAB — LIPID PANEL
CHOL/HDL RATIO: 5.5 ratio
CHOLESTEROL: 276 mg/dL — AB (ref 0–200)
HDL: 50 mg/dL (ref >39)
LDL CALC: 201 mg/dL — AB (ref 0–99)
TRIGLYCERIDES: 124 mg/dL (ref <150)
VLDL: 25 mg/dL (ref 0–40)

## 2013-09-27 MED ORDER — LOVASTATIN 40 MG PO TABS: 40.0000 mg | ORAL_TABLET | Freq: Every day | ORAL | Status: AC

## 2013-09-27 NOTE — Patient Instructions (Signed)
Keep up a regular exercise program and make sure you are eating a healthy diet Try to eat 4 servings of dairy a day, or if you are lactose intolerant take a calcium with vitamin D daily.  Your vaccines are up to date.   

## 2013-09-27 NOTE — Progress Notes (Signed)
Subjective:    Kaylee Marshall is a 78 y.o. female who presents for Medicare Annual/Subsequent preventive examination.  Preventive Screening-Counseling & Management  Tobacco History  Smoking status  . Never Smoker   Smokeless tobacco  . Not on file     Problems Prior to Visit 1.   Current Problems (verified) Patient Active Problem List   Diagnosis Date Noted  . Acute thoracic back pain 03/03/2013  . Trochanteric bursitis of left hip 09/02/2012  . Osteoporosis with fracture 05/27/2012  . CKD (chronic kidney disease) stage 3, GFR 30-59 ml/min 05/03/2012  . PERIPHERAL NEUROPATHY 10/08/2010  . MRI, BRAIN, ABNORMAL 12/13/2009  . L1 Vertebral compression fracture 12/01/2009  . VITAMIN D DEFICIENCY 10/12/2009  . HYPERLIPIDEMIA 10/12/2009  . DEMENTIA 10/05/2009  . MENOPAUSAL SYNDROME 10/05/2009  . BREAST CANCER, HX OF 10/05/2009    Medications Prior to Visit Current Outpatient Prescriptions on File Prior to Visit  Medication Sig Dispense Refill  . alendronate (FOSAMAX) 70 MG tablet Take 1 tablet (70 mg total) by mouth every 7 (seven) days. Take with a full glass of water on an empty stomach.  4 tablet  11  . Calcium Carbonate-Vitamin D 600-400 MG-UNIT per tablet Take 1 tablet by mouth 2 (two) times daily.  60 tablet  11  . DULoxetine (CYMBALTA) 60 MG capsule Take 60 mg by mouth daily.       No current facility-administered medications on file prior to visit.    Current Medications (verified) Current Outpatient Prescriptions  Medication Sig Dispense Refill  . alendronate (FOSAMAX) 70 MG tablet Take 1 tablet (70 mg total) by mouth every 7 (seven) days. Take with a full glass of water on an empty stomach.  4 tablet  11  . Calcium Carbonate-Vitamin D 600-400 MG-UNIT per tablet Take 1 tablet by mouth 2 (two) times daily.  60 tablet  11  . DULoxetine (CYMBALTA) 60 MG capsule Take 60 mg by mouth daily.       No current facility-administered medications for this visit.      Allergies (verified) Atorvastatin; Codeine; Colesevelam; and Rosuvastatin   PAST HISTORY  Family History Family History  Problem Relation Age of Onset  . Cancer      family 1st degree < 35  . Diabetes      family hX 1st degree relative  . Hyperlipidemia      family history  . Hypertension      family history  . Stroke      family history relative < 50    Social History History  Substance Use Topics  . Smoking status: Never Smoker   . Smokeless tobacco: Not on file  . Alcohol Use: No     Are there smokers in your home (other than you)? No  Risk Factors Current exercise habits: works at the [employer] issues discussed: none   Cardiac risk factors: advanced age (older than 71 for men, 31 for women).  Depression Screen (Note: if answer to either of the following is "Yes", a more complete depression screening is indicated)   Over the past two weeks, have you felt down, depressed or hopeless? No  Over the past two weeks, have you felt little interest or pleasure in doing things? No  Have you lost interest or pleasure in daily life? No  Do you often feel hopeless? No  Do you cry easily over simple problems? No  Activities of Daily Living In your present state of health, do you have any difficulty  performing the following activities?:  Driving? No Managing money?  No Feeding yourself? No Getting from bed to chair? No Climbing a flight of stairs? No Preparing food and eating?: No Bathing or showering? No Getting dressed: No Getting to the toilet? No Using the toilet:No Moving around from place to place: No In the past year have you fallen or had a near fall?:No   Are you sexually active?  No  Do you have more than one partner?  No  Hearing Difficulties: Yes Do you often ask people to speak up or repeat themselves? Yes Do you experience ringing or noises in your ears? No Do you have difficulty understanding soft or whispered voices? Yes   Do you feel  that you have a problem with memory? Yes  Do you often misplace items? Yes  Do you feel safe at home?  Yes  Cognitive Testing  Alert? Yes  Normal Appearance?Yes  Oriented to person? Yes  Place? Yes   Time? Yes  Recall of three objects?  Yes  Can perform simple calculations? Yes  Displays appropriate judgment?Yes  Can read the correct time from a watch face?Yes   Advanced Directives have been discussed with the patient? Yes  List the Names of Other Physician/Practitioners you currently use: 1.    Indicate any recent Medical Services you may have received from other than [facility] providers in the past year (date may be approximate).  Immunization History  Administered Date(s) Administered  . Pneumococcal Polysaccharide-23 10/05/2009  . Td 07/26/2004  . Tdap 10/25/2011    Screening Tests Health Maintenance  Topic Date Due  . Influenza Vaccine  08/25/2014  . Colonoscopy  10/25/2019  . Tetanus/tdap  10/24/2021  . Pneumococcal Polysaccharide Vaccine Age 14 And Over  Completed  . Zostavax  Addressed    All answers were reviewed with the patient and necessary referrals were made:  METHENEY,CATHERINE, MD   09/27/2013   History reviewed: allergies, current medications, past family history, past medical history, past social history, past surgical history and problem list  Review of Systems A comprehensive review of systems was negative.    Objective:     Vision ; eye exam is UTD  Body mass index is 27.41 kg/(m^2). BP 165/71  Pulse 65  Temp(Src) 97.4 F (36.3 C)  Ht 5' 1.6" (1.565 m)  Wt 148 lb (67.132 kg)  BMI 27.41 kg/m2  SpO2 98%  BP 165/71  Pulse 65  Temp(Src) 97.4 F (36.3 C)  Ht 5' 1.6" (1.565 m)  Wt 148 lb (67.132 kg)  BMI 27.41 kg/m2  SpO2 98%  General Appearance:    Alert, cooperative, no distress, appears stated age  Head:    Normocephalic, without obvious abnormality, atraumatic  Eyes:    PERRL, conjunctiva/corneas clear, EOM's intact, both eyes   Ears:    Normal TM's and external ear canals, both ears  Nose:   Nares normal, septum midline, mucosa normal, no drainage    or sinus tenderness  Throat:   Lips, mucosa, and tongue normal; teeth and gums normal  Neck:   Supple, symmetrical, trachea midline, no adenopathy;    thyroid:  no enlargement/tenderness/nodules; no carotid   bruit or JVD  Back:     Symmetric, no curvature, ROM normal, no CVA tenderness  Lungs:     Clear to auscultation bilaterally, respirations unlabored  Chest Wall:    No tenderness or deformity   Heart:    Regular rate and rhythm, S1 and S2 normal, no murmur, rub  or gallop  Breast Exam:    No tenderness, masses, or nipple abnormality  Abdomen:     Soft, non-tender, bowel sounds active all four quadrants,    no masses, no organomegaly  Genitalia:    Not performed  Rectal:    Not performed  Extremities:   Extremities normal, atraumatic, no cyanosis or edema  Pulses:   2+ and symmetric all extremities  Skin:   Skin color, texture, turgor normal, no rashes or lesions  Lymph nodes:   Cervical, supraclavicular, and axillary nodes normal  Neurologic:   CNII-XII intact, normal strength, sensation and reflexes    throughout       Assessment:     Medicare wellness Exam     Plan:     During the course of the visit the patient was educated and counseled about appropriate screening and preventive services including:    hyperlipidemia - reckeck labs.   CKD 3 - recheck Cr/BUN. F/U in 6 mo.   Diet review for nutrition referral? Yes ____  Not Indicated _X_   Patient Instructions (the written plan) was given to the patient.  Medicare Attestation I have personally reviewed: The patient's medical and social history Their use of alcohol, tobacco or illicit drugs Their current medications and supplements The patient's functional ability including ADLs,fall risks, home safety risks, cognitive, and hearing and visual impairment Diet and physical  activities Evidence for depression or mood disorders  The patient's weight, height, BMI, and visual acuity have been recorded in the chart.  I have made referrals, counseling, and provided education to the patient based on review of the above and I have provided the patient with a written personalized care plan for preventive services.     METHENEY,CATHERINE, MD   09/27/2013

## 2013-09-28 ENCOUNTER — Other Ambulatory Visit: Payer: Self-pay | Admitting: *Deleted

## 2013-09-28 DIAGNOSIS — E785 Hyperlipidemia, unspecified: Secondary | ICD-10-CM

## 2013-12-17 LAB — LIPID PANEL
CHOLESTEROL: 219 mg/dL — AB (ref 0–200)
HDL: 52 mg/dL (ref >39)
LDL Cholesterol: 144 mg/dL — ABNORMAL HIGH (ref 0–99)
TRIGLYCERIDES: 115 mg/dL (ref <150)
Total CHOL/HDL Ratio: 4.2 Ratio
VLDL: 23 mg/dL (ref 0–40)

## 2013-12-17 LAB — BASIC METABOLIC PANEL
BUN: 27 mg/dL — AB (ref 6–23)
CO2: 22 mEq/L (ref 19–32)
CREATININE: 1.12 mg/dL — AB (ref 0.50–1.10)
Calcium: 9.3 mg/dL (ref 8.4–10.5)
Chloride: 105 mEq/L (ref 96–112)
Glucose, Bld: 106 mg/dL — ABNORMAL HIGH (ref 70–99)
POTASSIUM: 4.2 meq/L (ref 3.5–5.3)
Sodium: 139 mEq/L (ref 135–145)

## 2013-12-20 ENCOUNTER — Other Ambulatory Visit: Payer: Self-pay | Admitting: *Deleted

## 2013-12-20 DIAGNOSIS — N183 Chronic kidney disease, stage 3 unspecified: Secondary | ICD-10-CM

## 2013-12-20 DIAGNOSIS — E785 Hyperlipidemia, unspecified: Secondary | ICD-10-CM

## 2014-11-02 ENCOUNTER — Telehealth: Payer: Self-pay | Admitting: Family Medicine

## 2014-11-02 ENCOUNTER — Other Ambulatory Visit: Payer: Self-pay | Admitting: *Deleted

## 2014-11-02 ENCOUNTER — Ambulatory Visit (INDEPENDENT_AMBULATORY_CARE_PROVIDER_SITE_OTHER): Payer: Medicare PPO

## 2014-11-02 ENCOUNTER — Ambulatory Visit (INDEPENDENT_AMBULATORY_CARE_PROVIDER_SITE_OTHER): Payer: Medicare PPO | Admitting: Family Medicine

## 2014-11-02 ENCOUNTER — Encounter: Payer: Self-pay | Admitting: Family Medicine

## 2014-11-02 VITALS — BP 143/79 | HR 72 | Ht 62.0 in | Wt 138.0 lb

## 2014-11-02 DIAGNOSIS — E785 Hyperlipidemia, unspecified: Secondary | ICD-10-CM

## 2014-11-02 DIAGNOSIS — Z23 Encounter for immunization: Secondary | ICD-10-CM

## 2014-11-02 DIAGNOSIS — Z0000 Encounter for general adult medical examination without abnormal findings: Secondary | ICD-10-CM | POA: Diagnosis not present

## 2014-11-02 DIAGNOSIS — I6529 Occlusion and stenosis of unspecified carotid artery: Secondary | ICD-10-CM

## 2014-11-02 DIAGNOSIS — S161XXA Strain of muscle, fascia and tendon at neck level, initial encounter: Secondary | ICD-10-CM

## 2014-11-02 DIAGNOSIS — M818 Other osteoporosis without current pathological fracture: Secondary | ICD-10-CM

## 2014-11-02 DIAGNOSIS — G8929 Other chronic pain: Secondary | ICD-10-CM

## 2014-11-02 DIAGNOSIS — R51 Headache: Secondary | ICD-10-CM

## 2014-11-02 DIAGNOSIS — M5032 Other cervical disc degeneration, mid-cervical region: Secondary | ICD-10-CM

## 2014-11-02 DIAGNOSIS — I6522 Occlusion and stenosis of left carotid artery: Secondary | ICD-10-CM

## 2014-11-02 DIAGNOSIS — R519 Headache, unspecified: Secondary | ICD-10-CM

## 2014-11-02 MED ORDER — SIMVASTATIN 40 MG PO TABS: 40.0000 mg | ORAL_TABLET | Freq: Every day | ORAL | Status: DC

## 2014-11-02 NOTE — Patient Instructions (Signed)
Keep up a regular exercise program and make sure you are eating a healthy diet Try to eat 4 servings of dairy a day, or if you are lactose intolerant take a calcium with vitamin D daily.  Your vaccines are up to date.   

## 2014-11-02 NOTE — Progress Notes (Signed)
Subjective:    Kaylee Marshall is a 79 y.o. female who presents for Medicare Annual/Subsequent preventive examination.  Preventive Screening-Counseling & Management  Tobacco History  Smoking status  . Never Smoker   Smokeless tobacco  . Not on file     Problems Prior to Visit 1. Neck stiffness x 1 months. Says pain at the bse of the skull and radiating into her head.  She denies any trauma or inury recently.  Has been using her heating pad.  occ using [employer].  Helps some.    Current Problems (verified) Patient Active Problem List   Diagnosis Date Noted  . Acute thoracic back pain 03/03/2013  . Trochanteric bursitis of left hip 09/02/2012  . Osteoporosis with fracture 05/27/2012  . CKD (chronic kidney disease) stage 3, GFR 30-59 ml/min 05/03/2012  . PERIPHERAL NEUROPATHY 10/08/2010  . MRI, BRAIN, ABNORMAL 12/13/2009  . L1 Vertebral compression fracture 12/01/2009  . VITAMIN D DEFICIENCY 10/12/2009  . Hyperlipidemia 10/12/2009  . DEMENTIA 10/05/2009  . MENOPAUSAL SYNDROME 10/05/2009  . BREAST CANCER, HX OF 10/05/2009    Medications Prior to Visit Current Outpatient Prescriptions on File Prior to Visit  Medication Sig Dispense Refill  . Calcium Carbonate-Vitamin D 600-400 MG-UNIT per tablet Take 1 tablet by mouth 2 (two) times daily. 60 tablet 11   No current facility-administered medications on file prior to visit.    Current Medications (verified) Current Outpatient Prescriptions  Medication Sig Dispense Refill  . Calcium Carbonate-Vitamin D 600-400 MG-UNIT per tablet Take 1 tablet by mouth 2 (two) times daily. 60 tablet 11  . simvastatin (ZOCOR) 40 MG tablet Take 40 mg by mouth daily at 6 PM.     No current facility-administered medications for this visit.     Allergies (verified) Atorvastatin; Codeine; Colesevelam; Cymbalta; and Rosuvastatin   PAST HISTORY  Family History Family History  Problem Relation Age of Onset  . Cancer      family 1st degree <  10  . Diabetes      family hX 1st degree relative  . Hyperlipidemia      family history  . Hypertension      family history  . Stroke      family history relative < 50    Social History History  Substance Use Topics  . Smoking status: Never Smoker   . Smokeless tobacco: Not on file  . Alcohol Use: No     Are there smokers in your home (other than you)? No  Risk Factors Current exercise habits: The patient does not participate in regular exercise at present.  Dietary issues discussed: None   Cardiac risk factors: advanced age (older than 54 for men, 41 for women), dyslipidemia and sedentary lifestyle.  Depression Screen (Note: if answer to either of the following is "Yes", a more complete depression screening is indicated)   Over the past two weeks, have you felt down, depressed or hopeless? No  Over the past two weeks, have you felt little interest or pleasure in doing things? No  Have you lost interest or pleasure in daily life? No  Do you often feel hopeless? No  Do you cry easily over simple problems? No  Activities of Daily Living In your present state of health, do you have any difficulty performing the following activities?:  Driving? Yes Managing money?  No Feeding yourself? No Getting from bed to chair? No  Climbing a flight of stairs? No Preparing food and eating?: No Bathing or showering? No Getting dressed:  No Getting to the toilet? No Using the toilet:No Moving around from place to place: No In the past year have you fallen or had a near fall?:Yes   Are you sexually active?  No  Do you have more than one partner?  No  Hearing Difficulties: Yes Do you often ask people to speak up or repeat themselves? Yes Do you experience ringing or noises in your ears? Yes Do you have difficulty understanding soft or whispered voices? Yes   Do you feel that you have a problem with memory? Yes  Do you often misplace items? Yes  Do you feel safe at home?   Yes  Cognitive Testing  Alert? Yes  Normal Appearance?Yes  Oriented to person? Yes  Place? Yes   Time? Yes  Recall of three objects?  Yes  Can perform simple calculations? Yes  Displays appropriate judgment?Yes  Can read the correct time from a watch face?Yes   Advanced Directives have been discussed with the patient? Yes  List the Names of Other Physician/Practitioners you currently use: 1.    Indicate any recent Medical Services you may have received from other than [facility] providers in the past year (date may be approximate).  Immunization History  Administered Date(s) Administered  . Pneumococcal Polysaccharide-23 10/05/2009  . Td 07/26/2004  . Tdap 10/25/2011    Screening Tests Health Maintenance  Topic Date Due  . PNA vac Low Risk Adult (2 of 2 - PCV13) 10/05/2010  . INFLUENZA VACCINE  03/26/2014  . COLONOSCOPY  10/25/2019  . TETANUS/TDAP  10/24/2021  . DEXA SCAN  Completed  . ZOSTAVAX  Addressed    All answers were reviewed with the patient and necessary referrals were made:  METHENEY,CATHERINE, MD   11/02/2014   History reviewed: allergies, current medications, past family history, past medical history, past social history, past surgical history and problem list  Review of Systems A comprehensive review of systems was negative.    Objective:     Vision by Snellen chart: right eye:20/40, left eye:20/50  Body mass index is 25.23 kg/(m^2). BP 143/79 mmHg  Pulse 72  Ht 5\' 2"  (1.575 m)  Wt 138 lb (62.596 kg)  BMI 25.23 kg/m2  BP 143/79 mmHg  Pulse 72  Ht 5\' 2"  (1.575 m)  Wt 138 lb (62.596 kg)  BMI 25.23 kg/m2 General appearance: alert, cooperative and appears stated age Head: Normocephalic, without obvious abnormality, atraumatic Eyes: conj clear, EOMi, PEERLA Ears: normal TM's and external ear canals both ears Nose: Nares normal. Septum midline. Mucosa normal. No drainage or sinus tenderness. Throat: lips, mucosa, and tongue normal; teeth and gums  normal Neck: no adenopathy, no carotid bruit, no JVD, supple, symmetrical, trachea midline, thyroid not enlarged, symmetric, no tenderness/mass/nodules and DEC rom of neck.  Minimal flexion, extension, roation and side bending. rotation right and left is only about 15% in each direction.  nontender over the spine.  Tender at the occiput on the left side Back: symmetric, no curvature. ROM normal. No CVA tenderness. Lungs: clear to auscultation bilaterally Breasts: normal appearance, no masses or tenderness , + implants. No nipple discharge.  Heart: regular rate and rhythm, S1, S2 normal, no murmur, click, rub or gallop Abdomen: soft, non-tender; bowel sounds normal; no masses,  no organomegaly Extremities: extremities normal, atraumatic, no cyanosis or edema Pulses: 2+ and symmetric Skin: Skin color, texture, turgor normal. No rashes or lesions Lymph nodes: Cervical, supraclavicular, and axillary nodes normal. Neurologic: Alert and oriented X 3, normal strength and tone.  Normal symmetric reflexes. Normal coordination and gait     Assessment:     Medicare annual wellness      Plan:     During the course of the visit the patient was educated and counseled about appropriate screening and preventive services including:    Colorectal cancer screening   Neck pain with dec ROM. Will get xray and will refer for PT. because of the abrupt onset and advanced age without any recent trauma or injury on clinical head and get an x-ray today.  Has colonocopy schedule in April.   Prevnar 13  Increas activity level.   Diet review for nutrition referral? Yes ____  Not Indicated __X_   Patient Instructions (the written plan) was given to the patient.  Medicare Attestation I have personally reviewed: The patient's medical and social history Their use of alcohol, tobacco or illicit drugs Their current medications and supplements The patient's functional ability including ADLs,fall risks, home  safety risks, cognitive, and hearing and visual impairment Diet and physical activities Evidence for depression or mood disorders  The patient's weight, height, BMI, and visual acuity have been recorded in the chart.  I have made referrals, counseling, and provided education to the patient based on review of the above and I have provided the patient with a written personalized care plan for preventive services.     METHENEY,CATHERINE, MD   11/02/2014

## 2014-11-02 NOTE — Telephone Encounter (Signed)
Error

## 2014-11-03 LAB — CBC
HCT: 42.8 % (ref 36.0–46.0)
Hemoglobin: 14.2 g/dL (ref 12.0–15.0)
MCH: 30.8 pg (ref 26.0–34.0)
MCHC: 33.2 g/dL (ref 30.0–36.0)
MCV: 92.8 fL (ref 78.0–100.0)
MPV: 10 fL (ref 8.6–12.4)
Platelets: 286 10*3/uL (ref 150–400)
RBC: 4.61 MIL/uL (ref 3.87–5.11)
RDW: 13.7 % (ref 11.5–15.5)
WBC: 7.1 10*3/uL (ref 4.0–10.5)

## 2014-11-03 LAB — LIPID PANEL
CHOL/HDL RATIO: 4.7 ratio
Cholesterol: 273 mg/dL — ABNORMAL HIGH (ref 0–200)
HDL: 58 mg/dL (ref >=46)
LDL Cholesterol: 193 mg/dL — ABNORMAL HIGH (ref 0–99)
Triglycerides: 109 mg/dL (ref <150)
VLDL: 22 mg/dL (ref 0–40)

## 2014-11-03 LAB — COMPLETE METABOLIC PANEL WITH GFR
ALK PHOS: 67 U/L (ref 39–117)
ALT: 16 U/L (ref 0–35)
AST: 23 U/L (ref 0–37)
Albumin: 4.5 g/dL (ref 3.5–5.2)
BILIRUBIN TOTAL: 0.5 mg/dL (ref 0.2–1.2)
BUN: 24 mg/dL — ABNORMAL HIGH (ref 6–23)
CHLORIDE: 104 meq/L (ref 96–112)
CO2: 24 meq/L (ref 19–32)
CREATININE: 0.99 mg/dL (ref 0.50–1.10)
Calcium: 9.6 mg/dL (ref 8.4–10.5)
GFR, Est African American: 64 mL/min
GFR, Est Non African American: 55 mL/min — ABNORMAL LOW
Glucose, Bld: 106 mg/dL — ABNORMAL HIGH (ref 70–99)
POTASSIUM: 4.2 meq/L (ref 3.5–5.3)
Sodium: 139 mEq/L (ref 135–145)
Total Protein: 7.5 g/dL (ref 6.0–8.3)

## 2014-11-03 LAB — SEDIMENTATION RATE: Sed Rate: 34 mm/hr — ABNORMAL HIGH (ref 0–30)

## 2014-11-04 ENCOUNTER — Telehealth: Payer: Self-pay

## 2014-11-04 MED ORDER — PITAVASTATIN CALCIUM 2 MG PO TABS
2.0000 mg | ORAL_TABLET | Freq: Every day | ORAL | Status: AC
Start: 2014-11-04 — End: ?

## 2014-11-04 NOTE — Telephone Encounter (Signed)
Kaylee Marshall,  Patient left a message on the triage voicemail stating she has not received a call from PT.

## 2014-11-04 NOTE — Addendum Note (Signed)
Addended by: Beatrice Lecher D on: 11/04/2014 07:57 AM   Modules accepted: Orders

## 2014-11-08 NOTE — Telephone Encounter (Signed)
I called patient to inform her Physical Therapy tried calling her and her was line was busy, I transferred patient to PT to schedule appt time

## 2014-11-09 ENCOUNTER — Ambulatory Visit (HOSPITAL_BASED_OUTPATIENT_CLINIC_OR_DEPARTMENT_OTHER)
Admission: RE | Admit: 2014-11-09 | Discharge: 2014-11-09 | Disposition: A | Discharge status: Home / Self Care | Payer: Medicare PPO | Source: Ambulatory Visit | Attending: Family Medicine | Admitting: Family Medicine

## 2014-11-09 ENCOUNTER — Other Ambulatory Visit: Payer: Self-pay | Admitting: *Deleted

## 2014-11-09 ENCOUNTER — Ambulatory Visit (INDEPENDENT_AMBULATORY_CARE_PROVIDER_SITE_OTHER): Payer: Medicare PPO | Admitting: Physical Therapy

## 2014-11-09 DIAGNOSIS — I6523 Occlusion and stenosis of bilateral carotid arteries: Secondary | ICD-10-CM | POA: Insufficient documentation

## 2014-11-09 DIAGNOSIS — I6529 Occlusion and stenosis of unspecified carotid artery: Secondary | ICD-10-CM

## 2014-11-09 DIAGNOSIS — R29898 Other symptoms and signs involving the musculoskeletal system: Secondary | ICD-10-CM

## 2014-11-09 DIAGNOSIS — M542 Cervicalgia: Secondary | ICD-10-CM

## 2014-11-09 DIAGNOSIS — M436 Torticollis: Secondary | ICD-10-CM

## 2014-11-09 NOTE — Therapy (Signed)
[facility] [place], [place], [address] Phone: [phone]   Fax:  [phone]  Physical Therapy Evaluation  Patient Details  Name: Kaylee Marshall MRN: [id number] Date of Birth: 06/25/1936 Referring Provider:  Hali Marry, *  Encounter Date: 11/09/2014      PT End of Session - 11/09/14 1230    Visit Number 1   Number of Visits 13   Date for PT Re-Evaluation 12/21/14   PT Start Time 1103   PT Stop Time 1146   PT Time Calculation (min) 43 min   Activity Tolerance Patient limited by pain   Behavior During Therapy [facility] for tasks assessed/performed      Past Medical History  Diagnosis Date  . Breast cancer     history of  . Headache(784.0)   . Abnormal MMSE 09-2009    27/30,   . Schatzki's ring [id number]    [facility]    Past Surgical History  Procedure Laterality Date  . Cesarean section    . Cataract extraction      bilateral  . Abdominal hysterectomy      complete  . Mastectomy  1968    bilateral, then reconstruction  . Back surgery      cervical, thoracic, lumbar  . Rt shoulder  01-2009  . Appendectomy      There were no vitals filed for this visit.  Visit Diagnosis:  Stiffness of neck - Plan: PT plan of care cert/re-cert  Pain in neck - Plan: PT plan of care cert/re-cert  Upper extremity weakness - Plan: PT plan of care cert/re-cert      Subjective Assessment - 11/09/14 1105    Symptoms Pt is a 79 y/o female who presents to OPPT with posterior neck pain x 3-4 weeks.  Pt and son report x ray last week and state during xray technician turned head and heard a pop.  Pt. then reports increased pain since xray.   Pertinent History dementia   Limitations Sitting;House hold activities   How long can you sit comfortably? 1 hour   Diagnostic tests x rays   Patient Stated Goals improve pain, improve LUE use   Currently in Pain? Yes   Pain Score 10-Worst pain  ever  "15"   Pain Location Neck   Pain Orientation Posterior;Left;Right   Pain Descriptors / Indicators --  "jumping pain"   Pain Type Chronic pain;Acute pain  subacute   Pain Onset 1 to 4 weeks ago   Pain Frequency Constant   Aggravating Factors  looking up/down, bending over   Pain Relieving Factors medication, sitting with head support            [facility] PT Assessment - 11/09/14 1113    Assessment   Medical Diagnosis neck pain   Onset Date --  2-3 weeks ago   Next MD Visit 1 month   Prior Therapy not for neck pain   Precautions   Precautions Fall   Restrictions   Weight Bearing Restrictions No   Balance Screen   Has the patient fallen in the past 6 months No  multiple near falls; "staggering into things"   Has the patient had a decrease in activity level because of a fear of falling?  No   Is the patient reluctant to leave their home because of a fear of falling?  No   Home Environment   Living Enviornment Private residence   Living Arrangements Children   Available Help  at Discharge Family;Available 24 hours/day   Type of Home House   Home Access Stairs to enter   Entrance Stairs-Number of Steps 3   Entrance Stairs-Rails None   Home Layout Two level;Able to live on main level with bedroom/bathroom   Prior Function   Level of Independence Independent with basic ADLs;Independent with gait;Independent with transfers;Needs assistance with homemaking   Vocation Retired   Leisure knitting   Observation/Other Assessments   Focus on Therapeutic Outcomes (FOTO)  13 (87% limited; predicted 51% limited)   AROM   AROM Assessment Site Cervical   Cervical Flexion 13   Cervical Extension 8   Cervical - Right Side Bend 6   Cervical - Left Side Bend 3   Cervical - Right Rotation 8   Cervical - Left Rotation 8   Strength   Strength Assessment Site Shoulder;Elbow   Right/Left Shoulder Right;Left   Right Shoulder Flexion 3+/5   Right Shoulder ABduction 3+/5   Right  Shoulder Internal Rotation 3+/5   Right Shoulder External Rotation 3/5   Left Shoulder Flexion 3/5   Left Shoulder ABduction 3-/5   Left Shoulder Internal Rotation 3-/5   Left Shoulder External Rotation 3-/5   Right/Left Elbow Right;Left   Right Elbow Flexion 4/5   Right Elbow Extension 4-/5   Left Elbow Flexion 3+/5   Left Elbow Extension 3/5   Palpation   Palpation pt very tender to palpation along bil cervical paraspinals and scalenes; soreness along upper trap and rhomboids.  Pt flinching and jumping with light touch to cervical muscles; pt unable to tolerate any further cervical testing or spinal mobilization   Special Tests    Special Tests Cervical   Cervical Tests Spurling's;Dictraction   Spurling's   Findings Positive   Comment symptoms relieved   Distraction Test   Findngs Positive   Comment symptoms relieved   Ambulation/Gait   Ambulation/Gait Yes   Ambulation/Gait Assistance 4: Min assist   Ambulation/Gait Assistance Details LOB x 3 with 50' amb needing 1 person min A to steady   Ambulation Distance (Feet) 50 Feet  x 2   Assistive device Straight cane;1 person hand held assist   Gait Pattern Step-to pattern   Ambulation Surface Level;Indoor   Gait velocity decreased                   [facility] Adult PT Treatment/Exercise - 11/09/14 1113    Posture/Postural Control   Posture/Postural Control Postural limitations   Postural Limitations Rounded Shoulders;Forward head;Increased thoracic kyphosis   Modalities   Modalities Electrical Stimulation;Moist Heat   Moist Heat Therapy   Number Minutes Moist Heat 10 Minutes   Moist Heat Location Other (comment)  neck   Electrical Stimulation   Electrical Stimulation Location neck   Electrical Stimulation Action IFC   Electrical Stimulation Parameters to tolerance   Electrical Stimulation Goals Pain                PT Education - 11/09/14 1230    Education provided Yes   Education Details clinical  findings and POC   Person(s) Educated Patient;Child(ren)   Methods Explanation   Comprehension Verbalized understanding             PT Long Term Goals - 11/09/14 1234    PT LONG TERM GOAL #1   Title independent with HEP (12/21/14)   Time 6   Period Weeks   Status New   PT LONG TERM GOAL #2   Title improve cervical ROM  by at least 10 degrees all motions for improved function (12/21/14)   Time 6   Period Weeks   Status New   PT LONG TERM GOAL #3   Title report pain < 6/10 for improved function (12/21/14)   Time 6   Period Weeks   Status New   PT LONG TERM GOAL #4   Title report ability to return to knitting for at least 25 min without increase in pain (12/21/14)   Time 6   Period Weeks   Status New               Plan - 11/09/14 1231    Clinical Impression Statement Pt presents to OPPT with significant functional limitations due to neck pain and UE weakness.  Pt very guarded and therefore limited evaluation performed due to pain and decreased tolerance.  Pt reports some relief after estim and heat.  Will benefit from PT to maximize function and improve strength and ROM.   Pt will benefit from skilled therapeutic intervention in order to improve on the following deficits Decreased range of motion;Impaired flexibility;Improper body mechanics;Postural dysfunction;Decreased activity tolerance;Pain;Impaired UE functional use;Increased muscle spasms;Decreased balance;Decreased strength   Rehab Potential Good   PT Frequency 2x / week   PT Duration 6 weeks   PT Treatment/Interventions ADLs/Self Care Home Management;Cryotherapy;Electrical Stimulation;Functional mobility training;Neuromuscular re-education;Manual techniques;Ultrasound;Traction;Therapeutic exercise;Passive range of motion;Patient/family education;Therapeutic activities;Moist Heat   PT Next Visit Plan continue modalities; gentle ROM and manual as pt can tolerate   Consulted and Agree with Plan of Care Patient;Family  member/caregiver   Family Member Consulted son         Problem List Patient Active Problem List   Diagnosis Date Noted  . Acute thoracic back pain 03/03/2013  . Trochanteric bursitis of left hip 09/02/2012  . Osteoporosis with fracture 05/27/2012  . CKD (chronic kidney disease) stage 3, GFR 30-59 ml/min 05/03/2012  . PERIPHERAL NEUROPATHY 10/08/2010  . MRI, BRAIN, ABNORMAL 12/13/2009  . L1 Vertebral compression fracture 12/01/2009  . VITAMIN D DEFICIENCY 10/12/2009  . Hyperlipidemia 10/12/2009  . DEMENTIA 10/05/2009  . MENOPAUSAL SYNDROME 10/05/2009  . BREAST CANCER, HX OF 10/05/2009   Laureen Abrahams, PT, DPT 11/09/2014 12:42 PM  [facility] [place], [place], [address] Phone: [phone]   Fax:  [phone]

## 2014-11-11 ENCOUNTER — Ambulatory Visit (INDEPENDENT_AMBULATORY_CARE_PROVIDER_SITE_OTHER): Payer: Medicare PPO | Admitting: Physical Therapy

## 2014-11-11 DIAGNOSIS — M436 Torticollis: Secondary | ICD-10-CM

## 2014-11-11 DIAGNOSIS — R29898 Other symptoms and signs involving the musculoskeletal system: Secondary | ICD-10-CM

## 2014-11-11 DIAGNOSIS — M542 Cervicalgia: Secondary | ICD-10-CM

## 2014-11-11 NOTE — Therapy (Signed)
[facility] [place] [place], [place], [address] Phone: [phone]   Fax:  [phone]  Physical Therapy Treatment  Patient Details  Name: Kaylee Marshall MRN: [id number] Date of Birth: 13-Dec-1935 Referring Provider:  Hali Marry, *  Encounter Date: 11/11/2014      PT End of Session - 11/11/14 1016    Visit Number 2   Number of Visits 13   Date for PT Re-Evaluation 12/21/14   PT Start Time 1017   PT Stop Time 1110   PT Time Calculation (min) 53 min   Activity Tolerance Patient limited by pain      Past Medical History  Diagnosis Date  . Breast cancer     history of  . Headache(784.0)   . Abnormal MMSE 09-2009    27/30,   . Schatzki's ring [id number]    [facility]    Past Surgical History  Procedure Laterality Date  . Cesarean section    . Cataract extraction      bilateral  . Abdominal hysterectomy      complete  . Mastectomy  1968    bilateral, then reconstruction  . Back surgery      cervical, thoracic, lumbar  . Rt shoulder  01-2009  . Appendectomy      There were no vitals filed for this visit.  Visit Diagnosis:  Stiffness of neck  Pain in neck  Upper extremity weakness      Subjective Assessment - 11/11/14 1040    Symptoms Pt reports pain has decreased since last visit. Pt reported she began taking muscle relaxers and is now getting sleep.    Pertinent History dementia   Limitations Sitting;House hold activities   Patient Stated Goals improve pain, improve LUE use   Pain Score 8    Pain Location Neck   Pain Orientation Posterior;Right;Left   Pain Descriptors / Indicators Sore;Constant  "catchy- grabby" sore   Aggravating Factors  turning head,  bending over    Pain Relieving Factors medication, sitting in chair            [facility] PT Assessment - 11/11/14 0001    Assessment   Medical Diagnosis neck pain   Onset Date --  2-3 weeks ago   Next MD  Visit 1 month   Prior Therapy not for neck pain   AROM   AROM Assessment Site Cervical   Cervical - Right Rotation 20   Cervical - Left Rotation 16                   [facility] Adult PT Treatment/Exercise - 11/11/14 0001    Exercises   Exercises Neck;Shoulder   Neck Exercises: Supine   Cervical Isometrics Left rotation;3 secs;5 reps;Extension   Shoulder Flexion Right;Left  towards ear; tremulous throughout arc. 8 reps.    Other Supine Exercise Head presses with 3 sec hold x 10, shoulder presses 3 sec hold x 10. Shoulder shrugs x 10. AROM of cervical rotation x 5 reps each side.    Modalities   Modalities Electrical Stimulation;Moist Heat   Moist Heat Therapy   Number Minutes Moist Heat 12 Minutes   Moist Heat Location --  neck   Electrical Stimulation   Electrical Stimulation Location neck   Electrical Stimulation Action IFC   Electrical Stimulation Parameters to tolerance   Electrical Stimulation Goals Pain   Manual Therapy   Manual Therapy Myofascial release;Passive ROM   Myofascial Release gentle suboccipital release and  gentle MFR to bilat upper traps.   Passive ROM cervical lateral flexion and rotation (very guarded and limited)                      PT Long Term Goals - 11/09/14 1234    PT LONG TERM GOAL #1   Title independent with HEP (12/21/14)   Time 6   Period Weeks   Status New   PT LONG TERM GOAL #2   Title improve cervical ROM by at least 10 degrees all motions for improved function (12/21/14)   Time 6   Period Weeks   Status New   PT LONG TERM GOAL #3   Title report pain < 6/10 for improved function (12/21/14)   Time 6   Period Weeks   Status New   PT LONG TERM GOAL #4   Title report ability to return to knitting for at least 25 min without increase in pain (12/21/14)   Time 6   Period Weeks   Status New               Plan - 11/11/14 1059    Clinical Impression Statement Pt presents with forward head posture in standing.   Once in supine, took moments for pt to tolerate supine without pillow and reduced guarding. (pt remains very sensitive with manual therapy). Pt tolerated all exercises well; demonstrated improved cervical rotation and reported decreased neck pain. Post treatment, pt with more upright posture.    Pt will benefit from skilled therapeutic intervention in order to improve on the following deficits Decreased range of motion;Impaired flexibility;Improper body mechanics;Postural dysfunction;Decreased activity tolerance;Pain;Impaired UE functional use;Increased muscle spasms;Decreased balance;Decreased strength   PT Frequency 2x / week   PT Duration 6 weeks   PT Treatment/Interventions ADLs/Self Care Home Management;Cryotherapy;Electrical Stimulation;Functional mobility training;Neuromuscular re-education;Manual techniques;Ultrasound;Traction;Therapeutic exercise;Passive range of motion;Patient/family education;Therapeutic activities;Moist Heat   PT Next Visit Plan Continue gentle ROM/strengthening for neck and UE.  Manual and modalities PRN.    Consulted and Agree with Plan of Care Patient;Family member/caregiver   Family Member Consulted son        Problem List Patient Active Problem List   Diagnosis Date Noted  . Acute thoracic back pain 03/03/2013  . Trochanteric bursitis of left hip 09/02/2012  . Osteoporosis with fracture 05/27/2012  . CKD (chronic kidney disease) stage 3, GFR 30-59 ml/min 05/03/2012  . PERIPHERAL NEUROPATHY 10/08/2010  . MRI, BRAIN, ABNORMAL 12/13/2009  . L1 Vertebral compression fracture 12/01/2009  . VITAMIN D DEFICIENCY 10/12/2009  . Hyperlipidemia 10/12/2009  . DEMENTIA 10/05/2009  . MENOPAUSAL SYNDROME 10/05/2009  . BREAST CANCER, HX OF 10/05/2009    Kerin Perna, PTA 11/11/2014 1:12 PM   [facility] [place], [place], [address] Phone: [phone]   Fax:  [phone]

## 2014-11-14 ENCOUNTER — Ambulatory Visit (INDEPENDENT_AMBULATORY_CARE_PROVIDER_SITE_OTHER): Payer: Medicare PPO | Admitting: Physical Therapy

## 2014-11-14 DIAGNOSIS — M436 Torticollis: Secondary | ICD-10-CM

## 2014-11-14 DIAGNOSIS — M542 Cervicalgia: Secondary | ICD-10-CM

## 2014-11-14 DIAGNOSIS — R29898 Other symptoms and signs involving the musculoskeletal system: Secondary | ICD-10-CM

## 2014-11-14 NOTE — Therapy (Signed)
[place], [place], [address] Phone: [phone]   Fax:  [phone]  Physical Therapy Treatment  Patient Details  Name: Kaylee Marshall MRN: [id number] Date of Birth: [birth year] Referring Provider:  Hali Marry, *  Encounter Date: 11/14/2014      PT End of Session - 11/14/14 1219    Visit Number 3   Number of Visits 13   Date for PT Re-Evaluation 12/21/14   PT Start Time 1150   PT Stop Time 1240   PT Time Calculation (min) 50 min      Past Medical History  Diagnosis Date  . Breast cancer     history of  . Headache(784.0)   . Abnormal MMSE 09-2009    27/30,   . Schatzki's ring [id number]    [facility]    Past Surgical History  Procedure Laterality Date  . Cesarean section    . Cataract extraction      bilateral  . Abdominal hysterectomy      complete  . Mastectomy  1968    bilateral, then reconstruction  . Back surgery      cervical, thoracic, lumbar  . Rt shoulder  01-2009  . Appendectomy      There were no vitals filed for this visit.  Visit Diagnosis:  Stiffness of neck  Pain in neck  Upper extremity weakness      Subjective Assessment - 11/14/14 1214    Symptoms [name] reports she had a fall over the weekend. (presents in [facility] today due to hip pain with WB)   Pertinent History dementia   Currently in Pain? Yes   Pain Score 8    Pain Location Neck   Pain Orientation Left;Right;Posterior   Pain Descriptors / Indicators Crushing;Sore   Aggravating Factors  turning head, bending over    Pain Relieving Factors medication, sitting in chair.             [facility] PT Assessment - 11/14/14 0001    Assessment   Medical Diagnosis neck pain   Precautions   Precautions Fall                   [facility] Adult PT Treatment/Exercise - 11/14/14 0001    Bed Mobility   Bed Mobility Right Sidelying to Sit;Sit to Sidelying Left   Right  Sidelying to Sit 4: Min assist;3: Mod assist   Sit to Sidelying Left 4: Min assist   Exercises   Exercises Shoulder;Neck   Neck Exercises: Supine   Cervical Isometrics Left lateral flexion;Left rotation;3 secs;5 reps   Cervical Rotation Left;10 reps  AROM   Other Supine Exercise Head presses with 3 sec hold x 10, shoulder presses 3 sec hold x 10. Shoulder shrugs x 10.    Shoulder Exercises: Supine   External Rotation 10 reps;Strengthening;Both;Theraband   Theraband Level (Shoulder External Rotation) Level 1 (Yellow)   Flexion Strengthening;Right;Left  2 sets of 8 reps   Shoulder Flexion Weight (lbs) 1   Manual Therapy   Manual Therapy Myofascial release;Passive ROM   Myofascial Release gentle suboccipital release and gentle MFR to bilat upper traps, Lt SCM   Passive ROM cervical Lt rotation (guarded)- improved from 5deg to 25 deg after manual. Improved with repetition.    Neck Exercises: Stretches   Neck Stretch 3 reps;10 seconds  lateral flex   Neck Stretch Limitations guarded  PT Long Term Goals - 11/09/14 1234    PT LONG TERM GOAL #1   Title independent with HEP (12/21/14)   Time 6   Period Weeks   Status New   PT LONG TERM GOAL #2   Title improve cervical ROM by at least 10 degrees all motions for improved function (12/21/14)   Time 6   Period Weeks   Status New   PT LONG TERM GOAL #3   Title report pain < 6/10 for improved function (12/21/14)   Time 6   Period Weeks   Status New   PT LONG TERM GOAL #4   Title report ability to return to knitting for at least 25 min without increase in pain (12/21/14)   Time 6   Period Weeks   Status New               Plan - 11/14/14 1222    Clinical Impression Statement Pt presents with improved head posture in sitting, however still laterally flexed & rotated to Rt in supine. Pt still very guarded and takes increased time to allow manual therapy.   Pt able to tolerate increased resistance  with shoulder exercises today. Pt improved cervical rotation after manual therapy and exercise.    Pt will benefit from skilled therapeutic intervention in order to improve on the following deficits Decreased range of motion;Impaired flexibility;Improper body mechanics;Postural dysfunction;Decreased activity tolerance;Pain;Impaired UE functional use;Increased muscle spasms;Decreased balance;Decreased strength   Rehab Potential Good   PT Frequency 2x / week   PT Duration 6 weeks   PT Treatment/Interventions ADLs/Self Care Home Management;Cryotherapy;Electrical Stimulation;Functional mobility training;Neuromuscular re-education;Manual techniques;Ultrasound;Traction;Therapeutic exercise;Passive range of motion;Patient/family education;Therapeutic activities;Moist Heat   PT Next Visit Plan Continue gentle ROM/strengthening for neck and UE.  Manual and modalities PRN.    Consulted and Agree with Plan of Care Patient;Family member/caregiver   Family Member Consulted son        Problem List Patient Active Problem List   Diagnosis Date Noted  . Acute thoracic back pain 03/03/2013  . Trochanteric bursitis of left hip 09/02/2012  . Osteoporosis with fracture 05/27/2012  . CKD (chronic kidney disease) stage 3, GFR 30-59 ml/min 05/03/2012  . PERIPHERAL NEUROPATHY 10/08/2010  . MRI, BRAIN, ABNORMAL 12/13/2009  . L1 Vertebral compression fracture 12/01/2009  . VITAMIN D DEFICIENCY 10/12/2009  . Hyperlipidemia 10/12/2009  . DEMENTIA 10/05/2009  . MENOPAUSAL SYNDROME 10/05/2009  . BREAST CANCER, HX OF 10/05/2009    Kerin Perna, PTA 11/14/2014 12:54 PM  [place], [place], [address] Phone: [phone]   Fax:  [phone]

## 2014-11-15 ENCOUNTER — Encounter: Payer: Self-pay | Admitting: Family Medicine

## 2014-11-15 ENCOUNTER — Ambulatory Visit (INDEPENDENT_AMBULATORY_CARE_PROVIDER_SITE_OTHER): Payer: Medicare PPO

## 2014-11-15 ENCOUNTER — Ambulatory Visit (INDEPENDENT_AMBULATORY_CARE_PROVIDER_SITE_OTHER): Payer: Medicare PPO | Admitting: Family Medicine

## 2014-11-15 VITALS — BP 125/72 | HR 87

## 2014-11-15 DIAGNOSIS — M47818 Spondylosis without myelopathy or radiculopathy, sacral and sacrococcygeal region: Secondary | ICD-10-CM

## 2014-11-15 DIAGNOSIS — R7301 Impaired fasting glucose: Secondary | ICD-10-CM | POA: Insufficient documentation

## 2014-11-15 DIAGNOSIS — M5030 Other cervical disc degeneration, unspecified cervical region: Secondary | ICD-10-CM | POA: Insufficient documentation

## 2014-11-15 DIAGNOSIS — S32010S Wedge compression fracture of first lumbar vertebra, sequela: Secondary | ICD-10-CM

## 2014-11-15 DIAGNOSIS — M8580 Other specified disorders of bone density and structure, unspecified site: Secondary | ICD-10-CM | POA: Diagnosis not present

## 2014-11-15 DIAGNOSIS — S161XXD Strain of muscle, fascia and tendon at neck level, subsequent encounter: Secondary | ICD-10-CM

## 2014-11-15 DIAGNOSIS — M4698 Unspecified inflammatory spondylopathy, sacral and sacrococcygeal region: Secondary | ICD-10-CM

## 2014-11-15 DIAGNOSIS — X58XXXS Exposure to other specified factors, sequela: Secondary | ICD-10-CM | POA: Diagnosis not present

## 2014-11-15 LAB — POCT GLYCOSYLATED HEMOGLOBIN (HGB A1C): HEMOGLOBIN A1C: 5.8

## 2014-11-15 MED ORDER — TRAMADOL HCL 50 MG PO TABS: 50.0000 mg | ORAL_TABLET | Freq: Three times a day (TID) | ORAL | Status: AC | PRN

## 2014-11-15 MED ORDER — TIZANIDINE HCL 2 MG PO TABS: 2.0000 mg | ORAL_TABLET | Freq: Every evening | ORAL | Status: AC | PRN

## 2014-11-15 NOTE — Patient Instructions (Signed)
2 extra strength tylenol 3 times a day

## 2014-11-15 NOTE — Progress Notes (Signed)
   Subjective:    Patient ID: Kaylee Marshall, female    DOB: [birth year], 79 y.o.   MRN: [id number]  HPI Follow-up on neck pain. Patient was seen on March 9 for her annual Medicare wellness exam. At that time she had complained with neck pain with decreased range of motion. We had ordered an x-ray and referred her to PT. Her son is here with her today and reports that he feels that during the x-ray process that day further injured her neck. X-ray showed thin bones with multiple levels of degenerative disc disease and facet disease of the cervical spine.   Also when she rolled over in bed the other day she felt a pop in her left hip. Says the pop wasn' t painful but started hurting the next day. She is having over her low back.  No numbness or tingling.  Hurts to walk on it.    She is sitting in a wheelchair today and here with her son.  ST x 2 days. No nasal congestion, cough, or ear pain. No fever, chills or sweats. No known sick contacts. No worsening or alleviating factors.  Review of Systems     Objective:   Physical Exam  Constitutional: She is oriented to person, place, and time. She appears well-developed and well-nourished.  HENT:  Head: Normocephalic and atraumatic.  Right Ear: External ear normal.  Left Ear: External ear normal.  Nose: Nose normal.  Mouth/Throat: Oropharynx is clear and moist.  TMs and canals are clear.   Eyes: Conjunctivae and EOM are normal. Pupils are equal, round, and reactive to light.  Neck: Neck supple. No thyromegaly present.  Cardiovascular: Normal rate, regular rhythm and normal heart sounds.   Pulmonary/Chest: Effort normal and breath sounds normal. She has no wheezes.  Musculoskeletal:  Neck with flexion to about 30 with very minimal extension. She has decreased rotation to about 20 bilaterally. It is symmetric. I did not have her flex forward at the lumbar spine secondary to balance issues. She is tender over the left SI joint. Hip, knee, ankle  strength is 5 out of 5 bilaterally. Nontender over the lumbar spine. Nontender over the mid buttock area.  Lymphadenopathy:    She has no cervical adenopathy.  Neurological: She is alert and oriented to person, place, and time.  Skin: Skin is warm and dry.  Psychiatric: She has a normal mood and affect.          Assessment & Plan:  Cervical strain with osteoarthritis and degenerative disc disease-continue with physical therapy. She has felt like it has been helpful but she still has a significantly decreased range of motion. Next  Left SI joint pain-will get x-ray to evaluate for possible dislocation. She is tender on exam but not extremely tender. But I am concerned that she's having difficulty standing and bearing weight because of the pain. Next  Pharyngitis-likely allergies or viral. No other significant symptoms and no fevers or chills. And exam is benign. If sore throat persists for more than one week then please come back in for further evaluation.  Abnormal glucose-glucose was mildly elevated on recent fasting blood work. Hemoglobin A1c today 5.8 showing impaired fasting glucose. Work on diet and recheck in 6 months.

## 2014-11-16 ENCOUNTER — Ambulatory Visit (INDEPENDENT_AMBULATORY_CARE_PROVIDER_SITE_OTHER): Payer: Medicare PPO | Admitting: Physical Therapy

## 2014-11-16 DIAGNOSIS — M542 Cervicalgia: Secondary | ICD-10-CM

## 2014-11-16 DIAGNOSIS — M436 Torticollis: Secondary | ICD-10-CM

## 2014-11-16 DIAGNOSIS — R29898 Other symptoms and signs involving the musculoskeletal system: Secondary | ICD-10-CM

## 2014-11-16 NOTE — Therapy (Signed)
[place], [place], [address] Phone: [phone]   Fax:  [phone]  Physical Therapy Treatment  Patient Details  Name: Kaylee Marshall MRN: [id number] Date of Birth: [birth year] Referring Provider:  Hali Marry, *  Encounter Date: 11/16/2014      PT End of Session - 11/16/14 1019    Visit Number 4   Number of Visits 13   Date for PT Re-Evaluation 12/21/14   PT Start Time 1020   PT Stop Time 1110   PT Time Calculation (min) 50 min   Activity Tolerance Patient limited by pain   Behavior During Therapy [facility] for tasks assessed/performed      Past Medical History  Diagnosis Date  . Breast cancer     history of  . Headache(784.0)   . Abnormal MMSE 09-2009    27/30,   . Schatzki's ring [id number]    [facility]    Past Surgical History  Procedure Laterality Date  . Cesarean section    . Cataract extraction      bilateral  . Abdominal hysterectomy      complete  . Mastectomy  1968    bilateral, then reconstruction  . Back surgery      cervical, thoracic, lumbar  . Rt shoulder  01-2009  . Appendectomy      There were no vitals filed for this visit.  Visit Diagnosis:  Stiffness of neck  Pain in neck  Upper extremity weakness      Subjective Assessment - 11/16/14 1031    Symptoms Saw MD yesterday and had xrays of lumbar spine. Chronic L1 compression fx which was present in 2013 MRI.    Pertinent History Osteoporosisi with fracture   Currently in Pain? Yes   Pain Score 8    Pain Location Neck   Pain Orientation Right;Left   Pain Descriptors / Indicators Sharp   Pain Type Chronic pain   Aggravating Factors  movement   Pain Relieving Factors rest   Multiple Pain Sites Yes   Pain Score 7   Pain Type Chronic pain   Pain Location Hip   Pain Orientation Left;Lateral   Pain Descriptors / Indicators Sharp;Sore   Pain Frequency Constant   Pain Onset  Unable to tell                       [facility] Adult PT Treatment/Exercise - 11/16/14 0001    Bed Mobility   Bed Mobility Supine to Sit;Sit to Supine   Supine to Sit 3: Mod assist   Supine to Sit Details (indicate cue type and reason) Reach to therapists arm, swing legs off table   Sit to Supine 4: Min guard   Transfers   Transfers Sit to Stand;Stand to Sit;Stand Pivot Transfers   Sit to Stand 4: Min guard   Stand to Sit 4: Min guard   Stand Pivot Transfers 4: Min guard   Neck Exercises: Supine   Neck Retraction 10 reps;5 secs   Cervical Rotation 5 reps  with retraction x 2 sets small ROM   Shoulder Flexion Both;10 reps   Shoulder Flexion Weights (lbs) 1   Shoulder Exercises: Supine   External Rotation 10 reps   Theraband Level (Shoulder External Rotation) Level 1 (Yellow)   Flexion Strengthening   Shoulder Flexion Weight (lbs) 1   Manual Therapy   Manual Therapy Myofascial release;Manual Traction   Myofascial Release STW to bil cerv paraspinals  and suboccipitals   Manual Traction 6 x 20 second bouts with active rot x 5  after 3rd and 6th bout  Patient reports relief with traction                PT Education - 11/16/14 1138    Education provided Yes   Education Details advised neck retractions with rotation throughout the day while sitting. Patient already doing retractions and rotations separately.   Person(s) Educated Patient   Methods Explanation;Demonstration   Comprehension Verbalized understanding;Returned demonstration             PT Long Term Goals - 11/09/14 1234    PT LONG TERM GOAL #1   Title independent with HEP (12/21/14)   Time 6   Period Weeks   Status New   PT LONG TERM GOAL #2   Title improve cervical ROM by at least 10 degrees all motions for improved function (12/21/14)   Time 6   Period Weeks   Status New   PT LONG TERM GOAL #3   Title report pain < 6/10 for improved function (12/21/14)   Time 6   Period Weeks   Status  New   PT LONG TERM GOAL #4   Title report ability to return to knitting for at least 25 min without increase in pain (12/21/14)   Time 6   Period Weeks   Status New               Problem List Patient Active Problem List   Diagnosis Date Noted  . Degenerative cervical disc 11/15/2014  . IFG (impaired fasting glucose) 11/15/2014  . Acute thoracic back pain 03/03/2013  . Trochanteric bursitis of left hip 09/02/2012  . Osteoporosis with fracture 05/27/2012  . CKD (chronic kidney disease) stage 3, GFR 30-59 ml/min 05/03/2012  . PERIPHERAL NEUROPATHY 10/08/2010  . MRI, BRAIN, ABNORMAL 12/13/2009  . L1 Vertebral compression fracture 12/01/2009  . VITAMIN D DEFICIENCY 10/12/2009  . Hyperlipidemia 10/12/2009  . DEMENTIA 10/05/2009  . MENOPAUSAL SYNDROME 10/05/2009  . BREAST CANCER, HX OF 10/05/2009    Madelyn Flavors PT  11/16/2014, 11:42 AM  [place] [place], [place], [address] Phone: [phone]   Fax:  [phone]

## 2014-11-21 ENCOUNTER — Telehealth: Payer: Self-pay | Admitting: Family Medicine

## 2014-11-21 ENCOUNTER — Ambulatory Visit (INDEPENDENT_AMBULATORY_CARE_PROVIDER_SITE_OTHER): Payer: Medicare PPO | Admitting: Physical Therapy

## 2014-11-21 DIAGNOSIS — M436 Torticollis: Secondary | ICD-10-CM

## 2014-11-21 DIAGNOSIS — R531 Weakness: Secondary | ICD-10-CM

## 2014-11-21 DIAGNOSIS — R29898 Other symptoms and signs involving the musculoskeletal system: Secondary | ICD-10-CM

## 2014-11-21 DIAGNOSIS — M542 Cervicalgia: Secondary | ICD-10-CM

## 2014-11-21 DIAGNOSIS — S161XXD Strain of muscle, fascia and tendon at neck level, subsequent encounter: Secondary | ICD-10-CM

## 2014-11-21 NOTE — Telephone Encounter (Signed)
Patients son came in and request help with his mom, Maybe getting Home Health--- Getting her up and down steps is hard for him, Maybe also --Home Health Physical Therapy  He states he is going to get a Handicap Form from [facility] for Dr. Madilyn Fireman to fill out for her.  Thanks

## 2014-11-21 NOTE — Therapy (Signed)
[place], [place], [address] Phone: [phone]   Fax:  [phone]  Physical Therapy Treatment  Patient Details  Name: Kaylee Marshall MRN: [id number] Date of Birth: 1936-06-18 Referring Provider:  Hali Marry, *  Encounter Date: 11/21/2014      PT End of Session - 11/21/14 1133    Visit Number 5   Number of Visits 13   Date for PT Re-Evaluation 12/21/14   PT Start Time 1100   PT Stop Time 1144   PT Time Calculation (min) 44 min   Activity Tolerance Patient limited by pain   Behavior During Therapy [facility] for tasks assessed/performed      Past Medical History  Diagnosis Date  . Breast cancer     history of  . Headache(784.0)   . Abnormal MMSE 09-2009    27/30,   . Schatzki's ring [id number]    [facility]    Past Surgical History  Procedure Laterality Date  . Cesarean section    . Cataract extraction      bilateral  . Abdominal hysterectomy      complete  . Mastectomy  1968    bilateral, then reconstruction  . Back surgery      cervical, thoracic, lumbar  . Rt shoulder  01-2009  . Appendectomy      There were no vitals filed for this visit.  Visit Diagnosis:  Stiffness of neck  Pain in neck  Upper extremity weakness      Subjective Assessment - 11/21/14 1109    Symptoms Continues to have difficulty with L hip pain.  In w/c today, son reports limited mobility   Pertinent History Osteoporosisi with fracture   Limitations Sitting;House hold activities   Currently in Pain? Yes   Pain Score 7    Pain Location Neck   Pain Orientation Right;Left   Pain Descriptors / Indicators Sharp   Pain Type Chronic pain   Pain Onset More than a month ago   Pain Frequency Constant   Aggravating Factors  movement   Pain Relieving Factors rest   Pain Score 9   Pain Location Hip   Pain Orientation Left;Lateral   Pain Descriptors / Indicators Sore;Sharp   Pain Type Chronic pain   Pain Frequency Constant                       [facility] Adult PT Treatment/Exercise - 11/21/14 1112    Transfers   Transfers Sit to Stand;Stand to [employer] Transfers   Sit to Stand 4: Min assist   Stand to Sit 4: Min assist   Stand Pivot Transfers 4: Min assist   Stand Pivot Transfer Details (indicate cue type and reason) cues for weight shifting   Neck Exercises: Supine   Neck Retraction 10 reps;5 secs   Cervical Rotation 10 reps;Both   Cervical Rotation Limitations 10 sec hold each direction   Shoulder Flexion Both;10 reps   Shoulder Flexion Limitations with cervical retraction   Other Supine Exercise supine scap retraction x 10   Modalities   Modalities Electrical Stimulation;Moist Heat   Moist Heat Therapy   Number Minutes Moist Heat 10 Minutes   Moist Heat Location Other (comment)  neck   Electrical Stimulation   Electrical Stimulation Location neck   Electrical Stimulation Action IFC   Electrical Stimulation Parameters to tolerance   Electrical Stimulation Goals Pain  PT Long Term Goals - 11/21/14 1135    PT LONG TERM GOAL #1   Title independent with HEP (12/21/14)   Status On-going   PT LONG TERM GOAL #2   Title improve cervical ROM by at least 10 degrees all motions for improved function (12/21/14)   Status On-going   PT LONG TERM GOAL #3   Title report pain < 6/10 for improved function (12/21/14)   Status On-going   PT LONG TERM GOAL #4   Title report ability to return to knitting for at least 25 min without increase in pain (12/21/14)   Status On-going               Plan - 11/21/14 1133    Clinical Impression Statement Pt with limited tolerance to exercise.  Now in w/c with limited functional mobility.  If function continues to be limited, may benefit from HHPT instead of OPPT due to limited ability to get to PT.    PT Next Visit Plan Continue gentle ROM/strengthening for neck and  UE.  Manual and modalities PRN.    Consulted and Agree with Plan of Care Patient;Family member/caregiver   Family Member Consulted son        Problem List Patient Active Problem List   Diagnosis Date Noted  . Degenerative cervical disc 11/15/2014  . IFG (impaired fasting glucose) 11/15/2014  . Acute thoracic back pain 03/03/2013  . Trochanteric bursitis of left hip 09/02/2012  . Osteoporosis with fracture 05/27/2012  . CKD (chronic kidney disease) stage 3, GFR 30-59 ml/min 05/03/2012  . PERIPHERAL NEUROPATHY 10/08/2010  . MRI, BRAIN, ABNORMAL 12/13/2009  . L1 Vertebral compression fracture 12/01/2009  . VITAMIN D DEFICIENCY 10/12/2009  . Hyperlipidemia 10/12/2009  . DEMENTIA 10/05/2009  . MENOPAUSAL SYNDROME 10/05/2009  . BREAST CANCER, HX OF 10/05/2009   Laureen Abrahams, PT, DPT 11/21/2014 12:33 PM  [facility] [place], [place], [address] Phone: [phone]   Fax:  [phone]

## 2014-11-21 NOTE — Telephone Encounter (Signed)
Home health ordered.

## 2014-11-23 ENCOUNTER — Ambulatory Visit (INDEPENDENT_AMBULATORY_CARE_PROVIDER_SITE_OTHER): Payer: Medicare PPO | Admitting: Physical Therapy

## 2014-11-23 ENCOUNTER — Telehealth: Payer: Self-pay | Admitting: Family Medicine

## 2014-11-23 DIAGNOSIS — R29898 Other symptoms and signs involving the musculoskeletal system: Secondary | ICD-10-CM

## 2014-11-23 DIAGNOSIS — M436 Torticollis: Secondary | ICD-10-CM | POA: Diagnosis not present

## 2014-11-23 DIAGNOSIS — M542 Cervicalgia: Secondary | ICD-10-CM

## 2014-11-23 NOTE — Telephone Encounter (Addendum)
Called and informed pt's son of recommendations. Orders faxed.[name]

## 2014-11-23 NOTE — Therapy (Signed)
[place], [place], [address] Phone: [phone]   Fax:  [phone]  Physical Therapy Treatment  Patient Details  Name: Kaylee Marshall MRN: [id number] Date of Birth: 1936-04-02 Referring Provider:  Hali Marry, *  Encounter Date: 11/23/2014      PT End of Session - 11/23/14 1319    Visit Number 6   Number of Visits 13   Date for PT Re-Evaluation 12/21/14   PT Start Time 9166   PT Stop Time 1047   PT Time Calculation (min) 32 min   Activity Tolerance Patient limited by pain   Behavior During Therapy [facility] for tasks assessed/performed      Past Medical History  Diagnosis Date  . Breast cancer     history of  . Headache(784.0)   . Abnormal MMSE 09-2009    27/30,   . Schatzki's ring [id number]    [facility]    Past Surgical History  Procedure Laterality Date  . Cesarean section    . Cataract extraction      bilateral  . Abdominal hysterectomy      complete  . Mastectomy  1968    bilateral, then reconstruction  . Back surgery      cervical, thoracic, lumbar  . Rt shoulder  01-2009  . Appendectomy      There were no vitals filed for this visit.  Visit Diagnosis:  Stiffness of neck  Pain in neck  Upper extremity weakness      Subjective Assessment - 11/23/14 1034    Symptoms pain is bad today, still limited mobility and almost no walking at home   Pertinent History Osteoporosis with fracture; dementia   Diagnostic tests x rays   Patient Stated Goals improve pain, improve LUE use   Currently in Pain? Yes   Pain Score 8    Pain Location Neck   Pain Orientation Right;Left   Pain Descriptors / Indicators Sharp   Pain Type Chronic pain   Pain Onset More than a month ago            [facility] PT Assessment - 11/23/14 1030    AROM   Cervical Flexion 17   Cervical Extension 20   Cervical - Right Side Bend 12   Cervical - Left Side Bend 12   Cervical - Right Rotation 12   Cervical - Left Rotation 8                           PT Education - 11/23/14 1054    Education provided Yes   Education Details Extensive discussion with pt/son on current progress and function.  Pt with significant functional change limiting ability to attend and participate in OPPT.  Discussed options and pt/son requesting d/c and referral to [facility].  PT in agreement with plan as pt homebound and with limited mobility to attend OPPT.   Person(s) Educated Patient;Child(ren)   Methods Explanation   Comprehension Verbalized understanding             PT Long Term Goals - 11/23/14 1029    PT LONG TERM GOAL #1   Title independent with HEP (12/21/14)   Status Achieved   PT LONG TERM GOAL #2   Title improve cervical ROM by at least 10 degrees all motions for improved function (12/21/14)   Status Not Met   PT LONG TERM GOAL #3   Title report pain <  6/10 for improved function (12/21/14)   Status Not Met   PT LONG TERM GOAL #4   Title report ability to return to knitting for at least 25 min without increase in pain (12/21/14)   Status Not Met               Plan - 11/23/14 1319    Clinical Impression Statement Pt with significant functional decline in mobility since beginning OPPT.  Pt with new onset of L hip pain (seen by MD) and limited progress towards goals have been made.  At this time, recommend d/c and return to MD for further follow up.  Feel pt more appropriate for [facility] services as pt has significant difficulty with transfers and mobility.  Pt is unable to drive at this time.  Recommend [facility] and HH therapies which request has been sent to MD.     PT Next Visit Plan d/c PT today   Consulted and Agree with Plan of Care Patient;Family member/caregiver   Family Member Consulted son        Problem List Patient Active Problem List   Diagnosis Date Noted  . Degenerative cervical disc 11/15/2014  . IFG (impaired fasting  glucose) 11/15/2014  . Acute thoracic back pain 03/03/2013  . Trochanteric bursitis of left hip 09/02/2012  . Osteoporosis with fracture 05/27/2012  . CKD (chronic kidney disease) stage 3, GFR 30-59 ml/min 05/03/2012  . PERIPHERAL NEUROPATHY 10/08/2010  . MRI, BRAIN, ABNORMAL 12/13/2009  . L1 Vertebral compression fracture 12/01/2009  . VITAMIN D DEFICIENCY 10/12/2009  . Hyperlipidemia 10/12/2009  . DEMENTIA 10/05/2009  . MENOPAUSAL SYNDROME 10/05/2009  . BREAST CANCER, HX OF 10/05/2009   Laureen Abrahams, PT, DPT 11/23/2014 1:23 PM  [facility] [place], [place], [address] Phone: [phone]   Fax:  [phone]    PHYSICAL THERAPY DISCHARGE SUMMARY  Visits from Start of Care: 6  Current functional level related to goals / functional outcomes: See above; pt with change in functional status and unable to actively participate in OPPT.   Remaining deficits: See above and initial eval.   Education / Equipment: HEP, recommendation for [facility] services  Plan: Patient agrees to discharge.  Patient goals were not met. Patient is being discharged due to a change in medical status.  ?????    Laureen Abrahams, PT, DPT 11/23/2014 1:24 PM  [facility] [place], [place] [address]  [phone] (office) [phone] (fax)

## 2014-11-23 NOTE — Telephone Encounter (Signed)
Please call pt or her son.  We got note from PT saying she really is getting worse.  I would like to have blood work to include Sed rate, CRP, CK, CBC, BMP to make sure she doesn't have some type of inflammatory or autoimmune process going on.

## 2014-12-12 ENCOUNTER — Ambulatory Visit: Payer: Self-pay | Admitting: Family Medicine

## 2014-12-25 DEATH — deceased

## 2016-06-19 IMAGING — US US CAROTID DUPLEX BILAT
1 series · 13 of 24 positions shown · non-contrast
Comparison: None.

CLINICAL DATA: Left neck pain. History of intracranial left ICA
aneurysm. History of hyperlipidemia.

EXAM:
BILATERAL CAROTID DUPLEX ULTRASOUND
TECHNIQUE: Gray scale imaging, color Doppler and duplex ultrasound were
performed of bilateral carotid and vertebral arteries in the neck.

[Series 1: us carotid duplex bilat · 0.07mm/px · 13 of 58 slices shown]
[im 1/58]
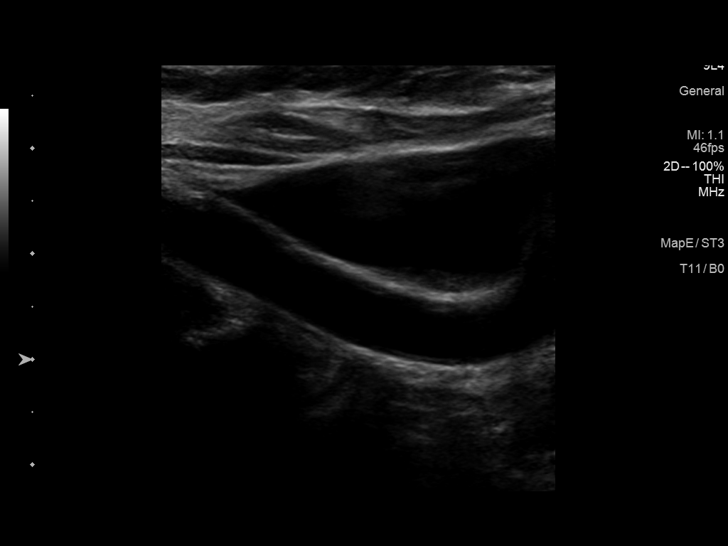
[im 5/58]
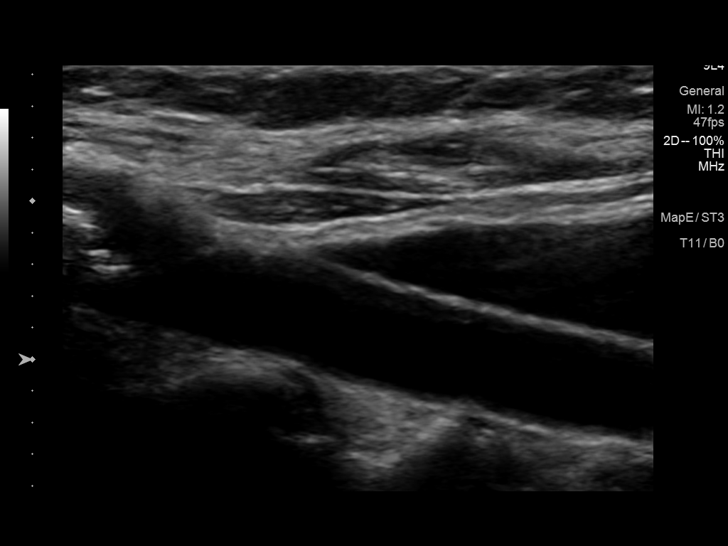
[im 10/58]
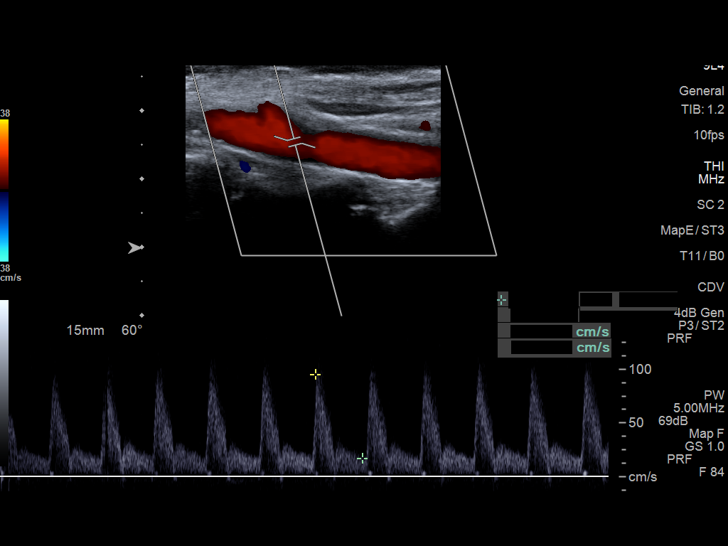
[im 15/58]
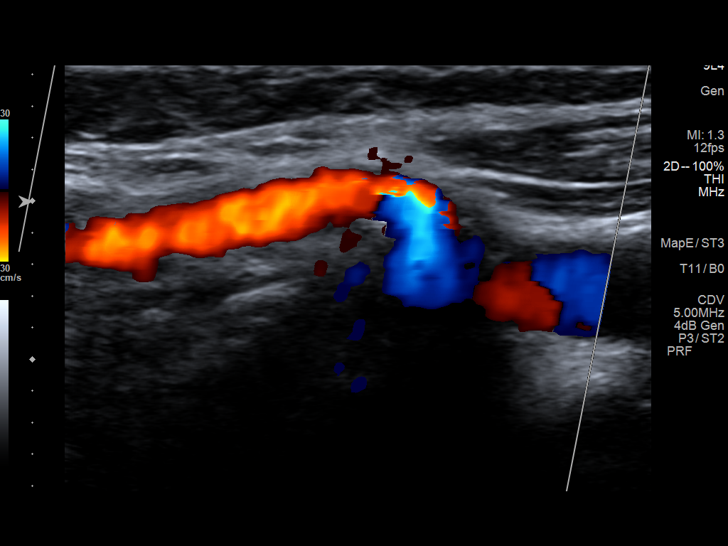
[im 20/58]
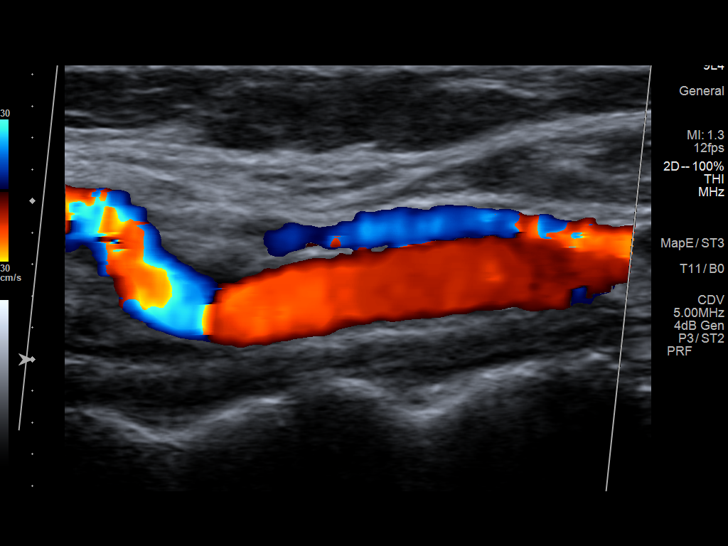
[im 25/58]
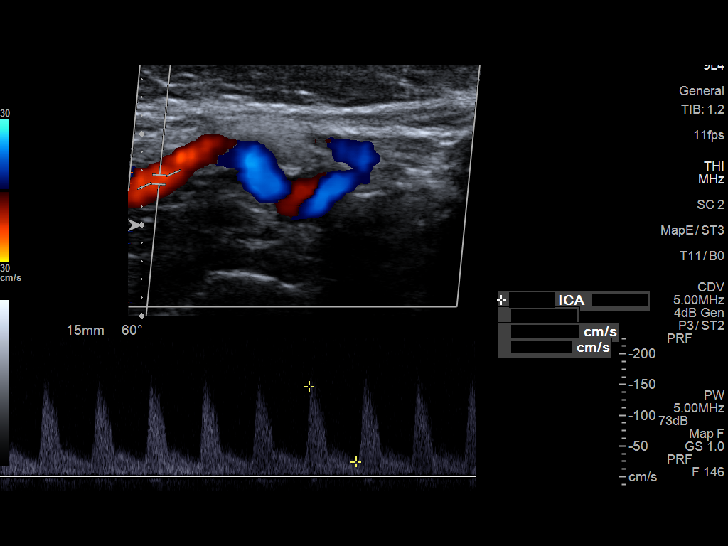
[im 30/58]
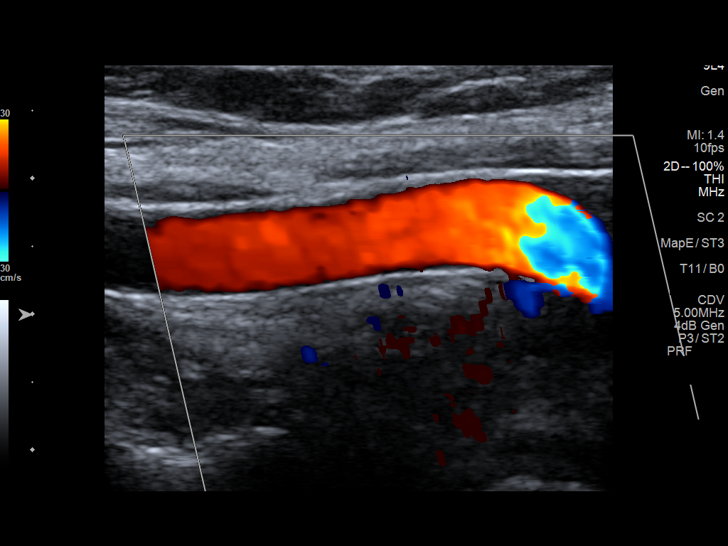
[im 33/58]
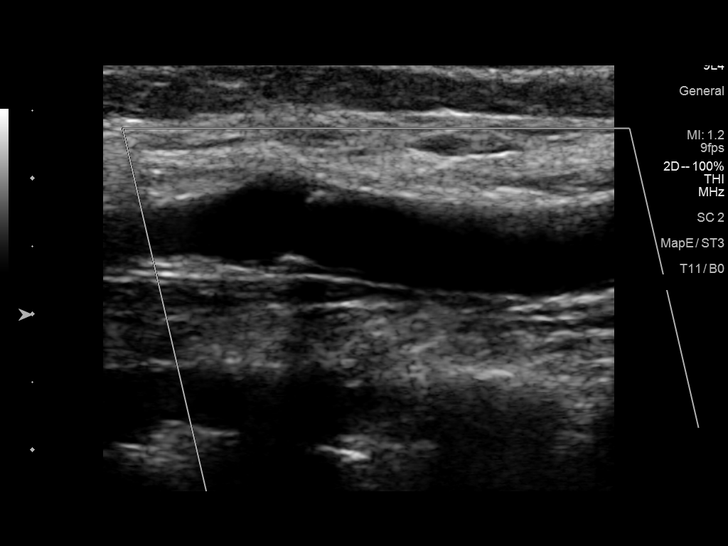
[im 38/58]
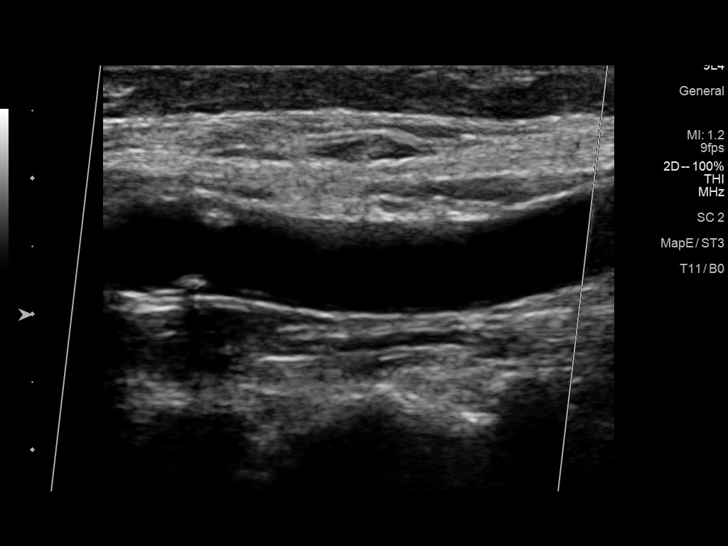
[im 43/58]
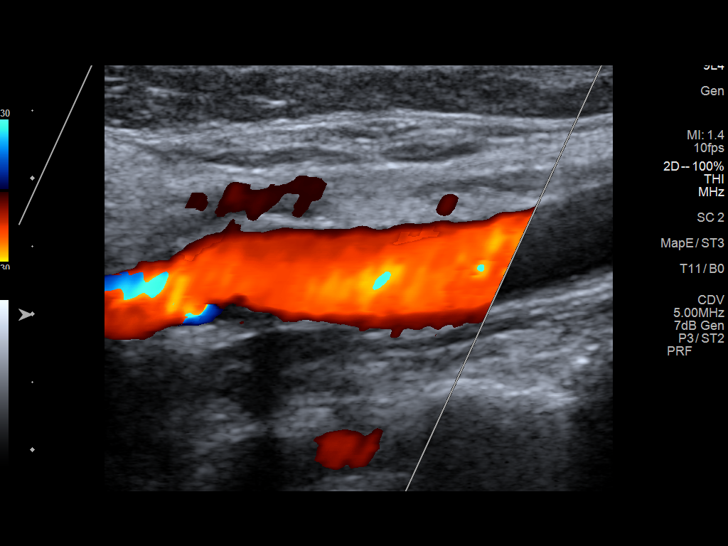
[im 48/58]
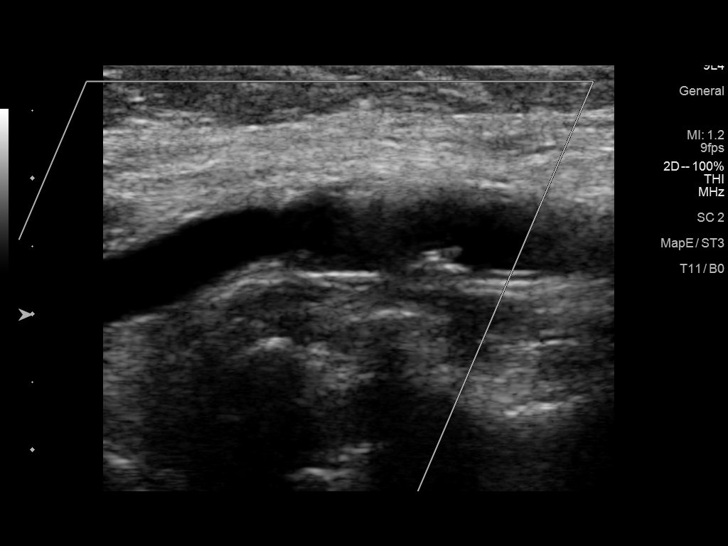
[im 53/58]
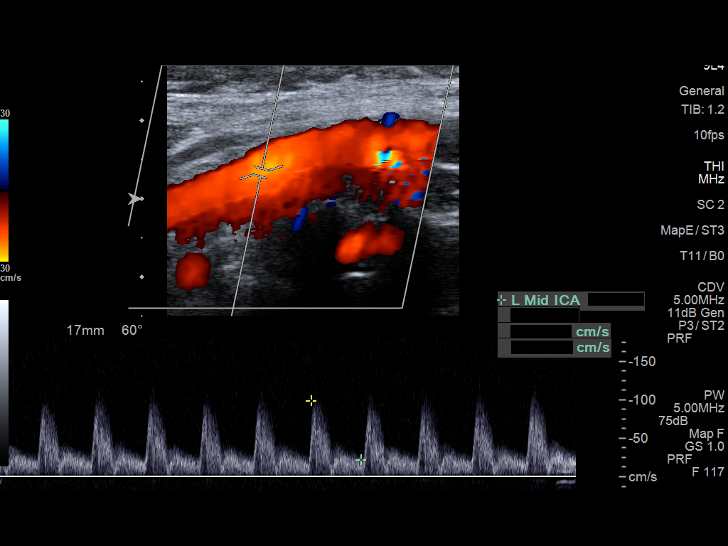
[im 58/58]
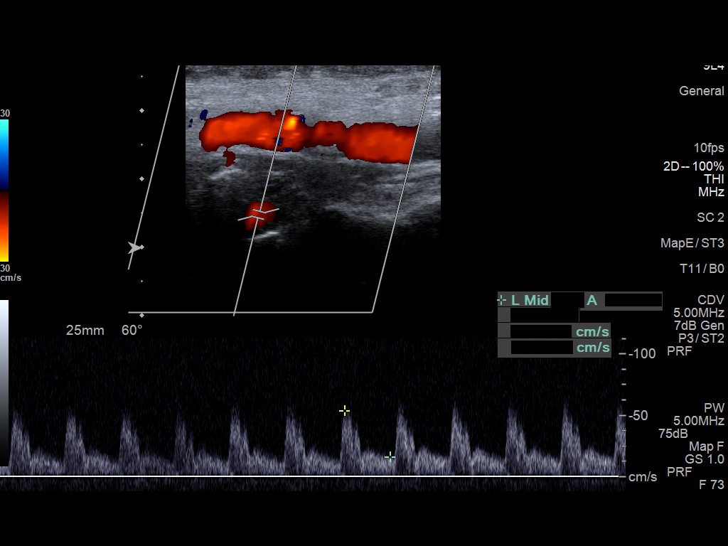

[13 of 24 positions shown; findings below may reference images not displayed]

FINDINGS: Criteria: Quantification of carotid stenosis is based on velocity
parameters that correlate the residual internal carotid diameter
with NASCET-based stenosis levels, using the diameter of the distal
internal carotid lumen as the denominator for stenosis measurement.

The following velocity measurements were obtained:

RIGHT

ICA:  90/24 cm/sec

CCA:  104/21 cm/sec

SYSTOLIC ICA/CCA RATIO:

DIASTOLIC ICA/CCA RATIO:

ECA:  114 cm/sec

LEFT

ICA:  99/22 cm/sec

CCA:  127/21 cm/sec

SYSTOLIC ICA/CCA RATIO:

DIASTOLIC ICA/CCA RATIO:

ECA:  126 cm/sec

RIGHT CAROTID ARTERY: The common carotid artery shows mild intimal
thickening. There is a tiny amount of plaque in the proximal ICA.
Waveforms and velocities are normal and estimated right ICA stenosis
is less than 50% there

RIGHT VERTEBRAL ARTERY: Antegrade flow with normal waveform and
velocity.

LEFT CAROTID ARTERY: A mild amount of predominately calcified plaque
is present at the level of the carotid bulb and proximal ICA. This
does cause a mild amount of turbulent flow in the proximal ICA.
Velocities and waveforms are within normal limits. Estimated left
ICA stenosis is less than 50%.

LEFT VERTEBRAL ARTERY: Antegrade flow with normal waveform and
velocity.
IMPRESSION: Mild amount of plaque at the left carotid bulb and proximal ICA
level and minimal plaque in the proximal right ICA. Estimated
bilateral ICA stenoses are less than 50%.

## 2016-06-25 IMAGING — CR DG LUMBAR SPINE COMPLETE 4+V
5 series · 5 of 5 positions shown · non-contrast
Comparison: MRI lumbar spine 05/08/2012

CLINICAL DATA: Low back pain after rolling over in bed in past 2
weeks, felt a pop in LEFT hip

EXAM:
LUMBAR SPINE - COMPLETE 4+ VIEW

[l-spine ap]
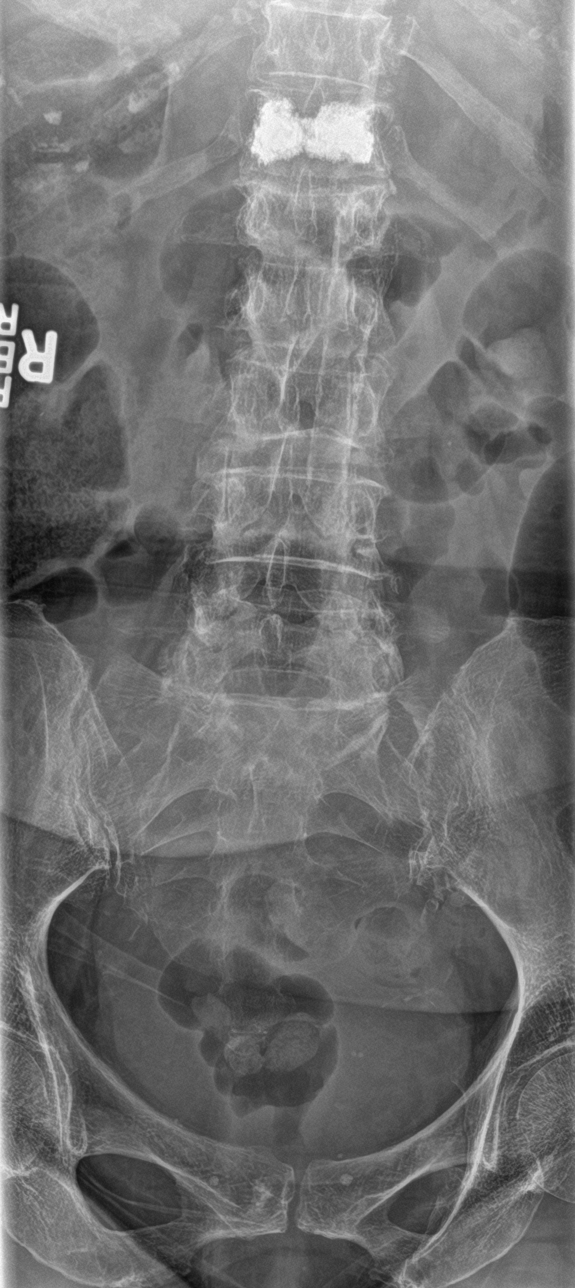

[l-spine obl (1 of 2)]
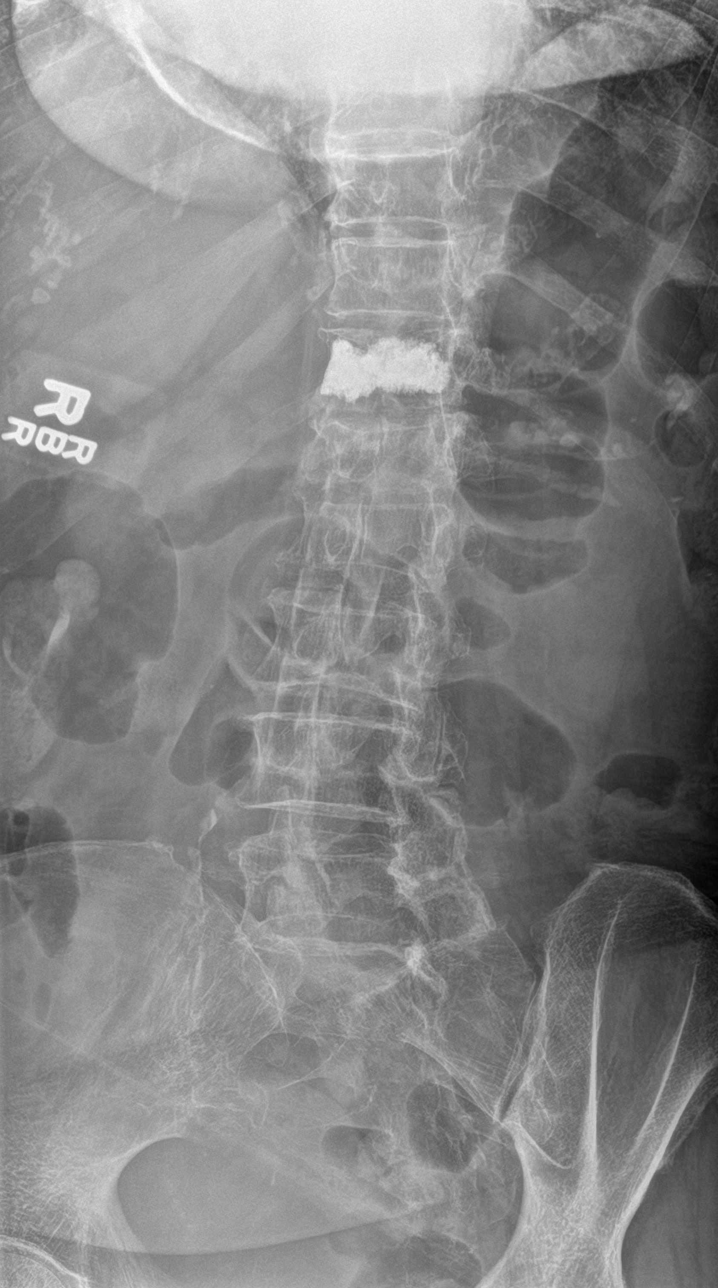

[l-spine obl (2 of 2)]
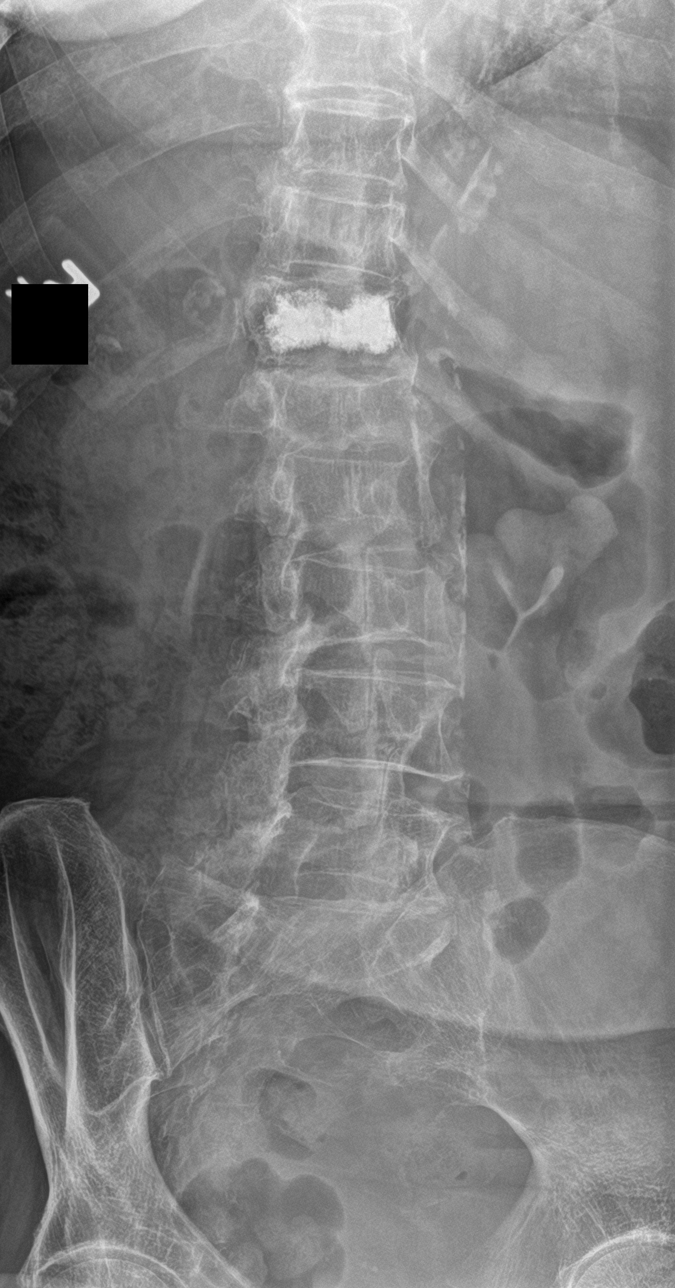

[l-spine lat]
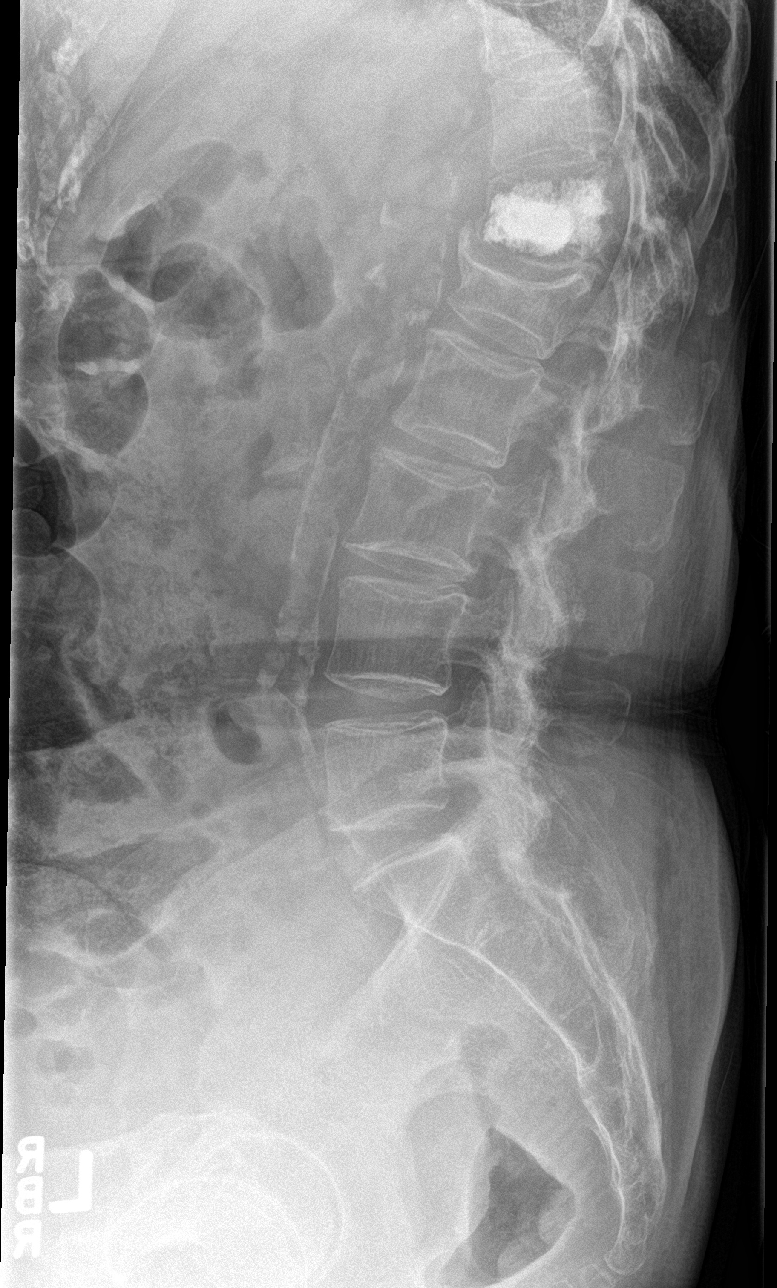

[l-spine spot]
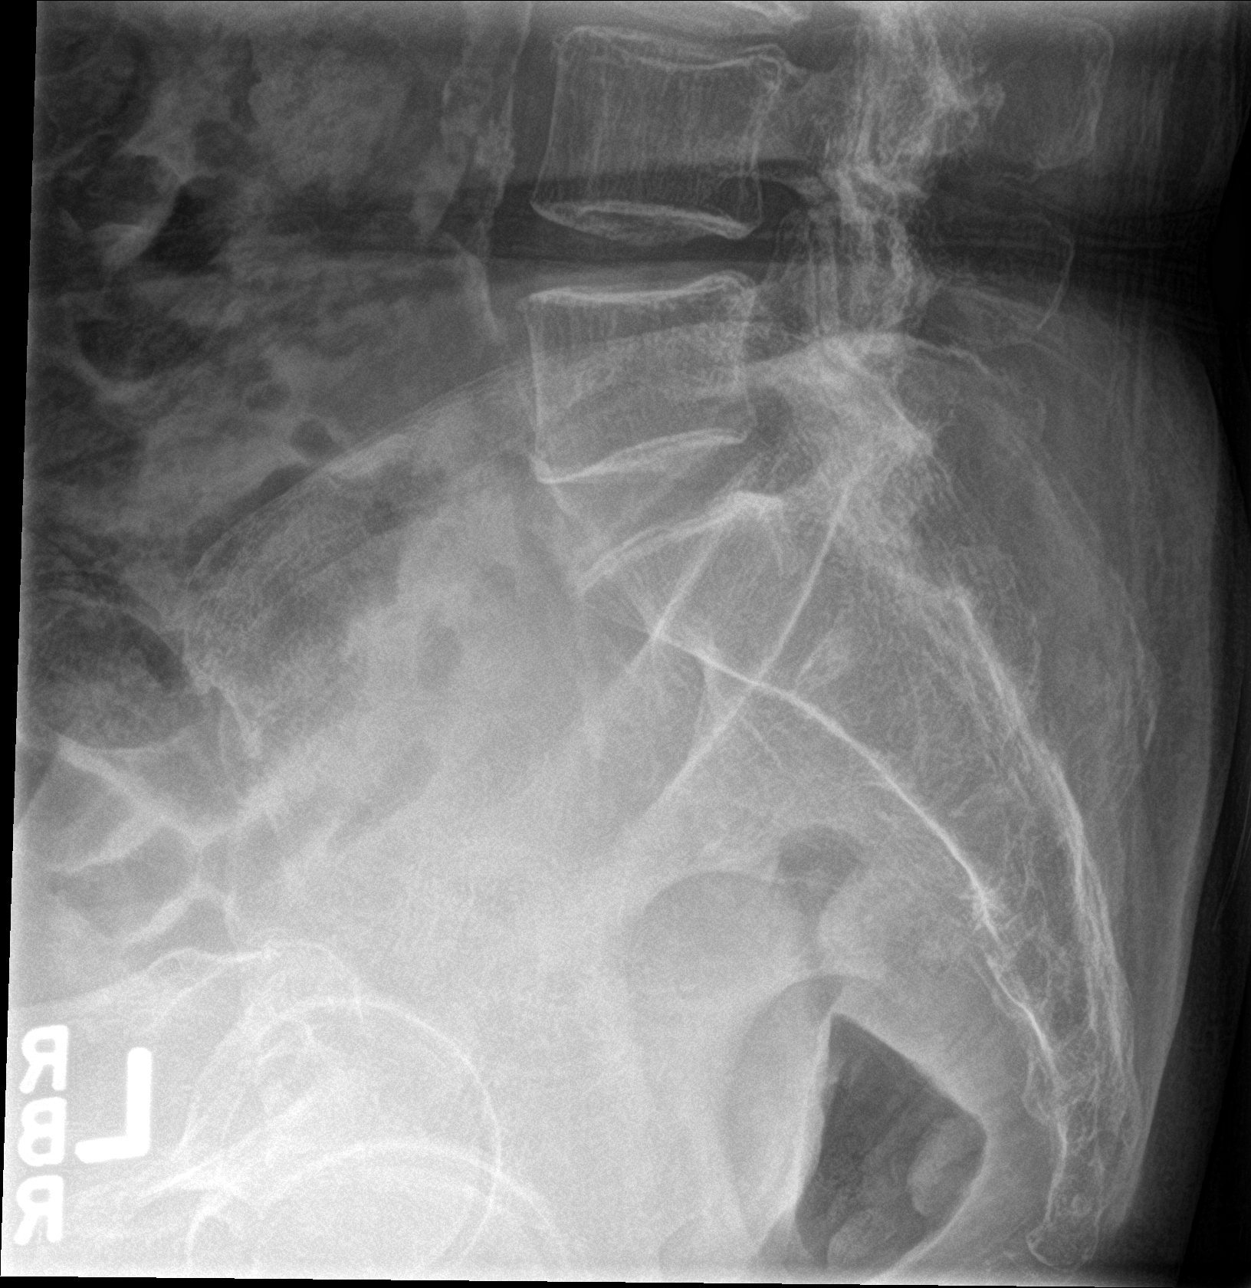

[5 of 5 positions shown; findings below may reference images not displayed]

FINDINGS: Diffuse osseous demineralization.

Five non-rib-bearing lumbar vertebrae.

Prior spinal augmentation procedure at at T12.

Facet degenerative changes lower lumbar spine.

Superior endplate concavities at L2 and L3 and inferior endplate
concavity at L3 again seen.

Chronic superior endplate compression fracture of L1 vertebra,
slightly increased since 2854.

Endplate spur formation superior L1.

Remaining vertebra demonstrate otherwise normal heights without
additional fracture or subluxation.

No spondylolysis.

Scattered atherosclerotic calcifications.

SI joints symmetric.
IMPRESSION: Osseous demineralization with slightly increased L1 compression
fracture since 2854.

Prior spinal augmentation procedure at T12.

Mild vertebral endplate concavities at L2 and L3.

No additional fracture or subluxation identified.
